# Patient Record
Sex: Female | Born: 1970 | Race: White | Hispanic: No | Marital: Married | Smoking: Never smoker
Health system: Southern US, Community
[De-identification: ages and names within clinical notes are randomized; demographics above are authoritative.]

## PROBLEM LIST (undated history)

## (undated) DIAGNOSIS — F419 Anxiety disorder, unspecified: Secondary | ICD-10-CM

## (undated) DIAGNOSIS — I10 Essential (primary) hypertension: Secondary | ICD-10-CM

## (undated) DIAGNOSIS — Z8669 Personal history of other diseases of the nervous system and sense organs: Secondary | ICD-10-CM

## (undated) DIAGNOSIS — Z87440 Personal history of urinary (tract) infections: Secondary | ICD-10-CM

## (undated) DIAGNOSIS — Z8659 Personal history of other mental and behavioral disorders: Secondary | ICD-10-CM

## (undated) DIAGNOSIS — Z8709 Personal history of other diseases of the respiratory system: Secondary | ICD-10-CM

## (undated) DIAGNOSIS — Z8639 Personal history of other endocrine, nutritional and metabolic disease: Secondary | ICD-10-CM

## (undated) HISTORY — DX: Personal history of other mental and behavioral disorders: Z86.59

## (undated) HISTORY — DX: Personal history of other endocrine, nutritional and metabolic disease: Z86.39

## (undated) HISTORY — DX: Personal history of other diseases of the respiratory system: Z87.09

## (undated) HISTORY — DX: Personal history of urinary (tract) infections: Z87.440

## (undated) HISTORY — DX: Personal history of other diseases of the nervous system and sense organs: Z86.69

---

## 2001-03-20 HISTORY — PX: DILATION AND CURETTAGE OF UTERUS: SHX78

## 2001-05-31 ENCOUNTER — Ambulatory Visit (HOSPITAL_COMMUNITY): Admission: RE | Admit: 2001-05-31 | Discharge: 2001-05-31 | Payer: Self-pay | Admitting: Obstetrics and Gynecology

## 2001-05-31 ENCOUNTER — Encounter: Payer: Self-pay | Admitting: Obstetrics and Gynecology

## 2001-09-10 ENCOUNTER — Other Ambulatory Visit: Admission: RE | Admit: 2001-09-10 | Discharge: 2001-09-10 | Payer: Self-pay | Admitting: Obstetrics and Gynecology

## 2002-11-05 ENCOUNTER — Other Ambulatory Visit: Admission: RE | Admit: 2002-11-05 | Discharge: 2002-11-05 | Payer: Self-pay | Admitting: Obstetrics and Gynecology

## 2003-04-01 ENCOUNTER — Inpatient Hospital Stay (HOSPITAL_COMMUNITY): Admission: AD | Admit: 2003-04-01 | Discharge: 2003-04-01 | Payer: Self-pay | Admitting: Obstetrics and Gynecology

## 2003-05-01 ENCOUNTER — Inpatient Hospital Stay (HOSPITAL_COMMUNITY): Admission: AD | Admit: 2003-05-01 | Discharge: 2003-05-01 | Payer: Self-pay | Admitting: Obstetrics and Gynecology

## 2003-05-05 ENCOUNTER — Inpatient Hospital Stay (HOSPITAL_COMMUNITY): Admission: AD | Admit: 2003-05-05 | Discharge: 2003-05-09 | Payer: Self-pay | Admitting: *Deleted

## 2003-06-11 ENCOUNTER — Other Ambulatory Visit: Admission: RE | Admit: 2003-06-11 | Discharge: 2003-06-11 | Payer: Self-pay | Admitting: Obstetrics and Gynecology

## 2004-06-23 ENCOUNTER — Other Ambulatory Visit: Admission: RE | Admit: 2004-06-23 | Discharge: 2004-06-23 | Payer: Self-pay | Admitting: Obstetrics and Gynecology

## 2006-02-14 ENCOUNTER — Inpatient Hospital Stay (HOSPITAL_COMMUNITY): Admission: AD | Admit: 2006-02-14 | Discharge: 2006-02-14 | Payer: Self-pay | Admitting: Obstetrics and Gynecology

## 2006-07-18 ENCOUNTER — Inpatient Hospital Stay (HOSPITAL_COMMUNITY): Admission: RE | Admit: 2006-07-18 | Discharge: 2006-07-20 | Payer: Self-pay | Admitting: Obstetrics and Gynecology

## 2011-11-13 ENCOUNTER — Other Ambulatory Visit: Payer: Self-pay | Admitting: Internal Medicine

## 2011-11-13 ENCOUNTER — Ambulatory Visit: Payer: Self-pay | Admitting: Internal Medicine

## 2011-11-14 ENCOUNTER — Ambulatory Visit: Payer: Self-pay | Admitting: Internal Medicine

## 2012-12-07 ENCOUNTER — Ambulatory Visit: Payer: Self-pay | Admitting: Neurology

## 2013-02-11 ENCOUNTER — Ambulatory Visit: Payer: Self-pay | Admitting: Neurology

## 2013-02-28 ENCOUNTER — Ambulatory Visit: Payer: Self-pay | Admitting: Neurology

## 2013-06-27 ENCOUNTER — Ambulatory Visit: Payer: Self-pay | Admitting: Family Medicine

## 2013-07-04 ENCOUNTER — Ambulatory Visit (INDEPENDENT_AMBULATORY_CARE_PROVIDER_SITE_OTHER): Payer: No Typology Code available for payment source | Admitting: Family Medicine

## 2013-07-04 ENCOUNTER — Other Ambulatory Visit (HOSPITAL_COMMUNITY)
Admission: RE | Admit: 2013-07-04 | Discharge: 2013-07-04 | Disposition: A | Discharge status: Home / Self Care | Payer: No Typology Code available for payment source | Source: Ambulatory Visit | Attending: Family Medicine | Admitting: Family Medicine

## 2013-07-04 ENCOUNTER — Encounter: Payer: Self-pay | Admitting: Family Medicine

## 2013-07-04 VITALS — BP 112/86 | HR 56 | Temp 98.6°F | Ht 62.25 in | Wt 158.0 lb

## 2013-07-04 DIAGNOSIS — Z0000 Encounter for general adult medical examination without abnormal findings: Secondary | ICD-10-CM | POA: Insufficient documentation

## 2013-07-04 DIAGNOSIS — Z124 Encounter for screening for malignant neoplasm of cervix: Secondary | ICD-10-CM | POA: Insufficient documentation

## 2013-07-04 DIAGNOSIS — M25476 Effusion, unspecified foot: Secondary | ICD-10-CM

## 2013-07-04 DIAGNOSIS — F341 Dysthymic disorder: Secondary | ICD-10-CM

## 2013-07-04 DIAGNOSIS — M25473 Effusion, unspecified ankle: Secondary | ICD-10-CM | POA: Insufficient documentation

## 2013-07-04 DIAGNOSIS — F418 Other specified anxiety disorders: Secondary | ICD-10-CM

## 2013-07-04 DIAGNOSIS — J309 Allergic rhinitis, unspecified: Secondary | ICD-10-CM

## 2013-07-04 DIAGNOSIS — Z23 Encounter for immunization: Secondary | ICD-10-CM

## 2013-07-04 DIAGNOSIS — N390 Urinary tract infection, site not specified: Secondary | ICD-10-CM

## 2013-07-04 DIAGNOSIS — Z01419 Encounter for gynecological examination (general) (routine) without abnormal findings: Secondary | ICD-10-CM | POA: Insufficient documentation

## 2013-07-04 DIAGNOSIS — G4733 Obstructive sleep apnea (adult) (pediatric): Secondary | ICD-10-CM | POA: Insufficient documentation

## 2013-07-04 DIAGNOSIS — Z1151 Encounter for screening for human papillomavirus (HPV): Secondary | ICD-10-CM | POA: Insufficient documentation

## 2013-07-04 LAB — CBC WITH DIFFERENTIAL/PLATELET
BASOS ABS: 0 10*3/uL (ref 0.0–0.1)
Basophils Relative: 0.5 % (ref 0.0–3.0)
EOS PCT: 1.4 % (ref 0.0–5.0)
Eosinophils Absolute: 0.1 10*3/uL (ref 0.0–0.7)
HCT: 36.1 % (ref 36.0–46.0)
Hemoglobin: 12.1 g/dL (ref 12.0–15.0)
Lymphocytes Relative: 28.2 % (ref 12.0–46.0)
Lymphs Abs: 1.7 10*3/uL (ref 0.7–4.0)
MCHC: 33.3 g/dL (ref 30.0–36.0)
MCV: 83.4 fl (ref 78.0–100.0)
MONOS PCT: 8.8 % (ref 3.0–12.0)
Monocytes Absolute: 0.5 10*3/uL (ref 0.1–1.0)
NEUTROS PCT: 61.1 % (ref 43.0–77.0)
Neutro Abs: 3.6 10*3/uL (ref 1.4–7.7)
PLATELETS: 226 10*3/uL (ref 150.0–400.0)
RBC: 4.33 Mil/uL (ref 3.87–5.11)
RDW: 13.5 % (ref 11.5–14.6)
WBC: 5.9 10*3/uL (ref 4.5–10.5)

## 2013-07-04 LAB — LIPID PANEL
CHOL/HDL RATIO: 6
Cholesterol: 200 mg/dL (ref 0–200)
HDL: 31 mg/dL — ABNORMAL LOW (ref >39.00)
LDL Cholesterol: 135 mg/dL — ABNORMAL HIGH (ref 0–99)
Triglycerides: 168 mg/dL — ABNORMAL HIGH (ref 0.0–149.0)
VLDL: 33.6 mg/dL (ref 0.0–40.0)

## 2013-07-04 LAB — COMPREHENSIVE METABOLIC PANEL
ALBUMIN: 3.9 g/dL (ref 3.5–5.2)
ALK PHOS: 59 U/L (ref 39–117)
ALT: 22 U/L (ref 0–35)
AST: 23 U/L (ref 0–37)
BUN: 14 mg/dL (ref 6–23)
CO2: 28 meq/L (ref 19–32)
Calcium: 9.2 mg/dL (ref 8.4–10.5)
Chloride: 104 mEq/L (ref 96–112)
Creatinine, Ser: 0.7 mg/dL (ref 0.4–1.2)
GFR: 96.54 mL/min (ref >60.00)
Glucose, Bld: 80 mg/dL (ref 70–99)
POTASSIUM: 3.7 meq/L (ref 3.5–5.1)
SODIUM: 140 meq/L (ref 135–145)
TOTAL PROTEIN: 7.3 g/dL (ref 6.0–8.3)
Total Bilirubin: 0.5 mg/dL (ref 0.3–1.2)

## 2013-07-04 LAB — TSH: TSH: 1.54 u[IU]/mL (ref 0.35–5.50)

## 2013-07-04 NOTE — Patient Instructions (Signed)
Labs today  Tdap vaccine today  Pap done today  Take care of yourself  Keep drinking water/ watch salt intake and elevate your feet when sitting and also support stockings if you can tolerate them

## 2013-07-04 NOTE — Progress Notes (Signed)
Pre visit review using our clinic review tool, if applicable. No additional management support is needed unless otherwise documented below in the visit note. 

## 2013-07-04 NOTE — Progress Notes (Signed)
Subjective:    Patient ID: Mallory Jones, female    DOB: 1970-11-26, 43 y.o.   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  Has a 51427 year old and 41 year old - boy and a girl    bmi is [id number]  Is due for gyn exam/pap - she is at the very end of her period - light Menses is changing with time - some clotting and then other times light - but regular  Does have PMS  She tried OC in the past -and it messed with her moods  Has had several CS  Td - needs one  Flu vaccine -does not get flu vaccines  Last pap spring 2014  Hx of depression /OCD/anxiety-- [facility] Sherryll BurgerShah is neurologist - had headache issues in the past  Klonopin Luvox (she wants to go back to zoloft)   Hx of all rhinitis  Hx of hyperlipidemia  She walks 2 1/2 mil per day , and also drinking lots of water  She is watching what she eats  Wants to loose weight   She tends to swell in ankles - put on hctz  No cardiac symptoms   Labs about a year ago   Patient Active Problem List   Diagnosis Date Noted  . Encounter for routine gynecological examination 07/04/2013  . Obstructive sleep apnea 07/04/2013  . Allergic rhinitis 07/04/2013  . Routine general medical examination at a health care facility 07/04/2013  . Depression with anxiety 07/04/2013  . Ankle swelling 07/04/2013  . Frequent UTI 07/04/2013   Past Medical History  Diagnosis Date  . History of depression   . History of hay fever   . History of hyperlipidemia   . History of UTI   . History of sleep apnea    Past Surgical History  Procedure Laterality Date  . Cesarean section  2005  . Cesarean section  2008  . Dilation and curettage of uterus  2003   History  Substance Use Topics  . Smoking status: Never  Smoker   . Smokeless tobacco: Not on file  . Alcohol Use: No   Family History  Problem Relation Age of Onset  . Alcohol abuse Father   . Colon cancer Father   . Ovarian cancer Maternal Grandmother   . Prostate cancer Maternal Grandfather   . Hyperlipidemia Father   . Kidney disease Maternal Grandmother   . Other Father     emotional illness   No Known Allergies No current outpatient prescriptions on file prior to visit.   No current facility-administered medications on file prior to visit.    Review of Systems    Review of Systems  Constitutional: Negative for fever, appetite change, fatigue and unexpected weight change.  Eyes: Negative for pain and visual disturbance.  Respiratory: Negative for cough and shortness of breath.   Cardiovascular: Negative for cp or palpitations    Gastrointestinal: Negative for nausea, diarrhea and constipation.  Genitourinary: Negative for urgency and frequency.  Skin: Negative for pallor or rash   Neurological: Negative for weakness, light-headedness, numbness and headaches.  Hematological: Negative for adenopathy. Does not bruise/bleed easily.  Psychiatric/Behavioral: pos for depression that is fairly controlled . The  patient is not nervous/anxious.      Objective:   Physical Exam  Constitutional: She appears well-developed and well-nourished. No distress.  HENT:  Head: Normocephalic and atraumatic.  Right Ear: External ear normal.  Left Ear: External ear normal.  Nose: Nose normal.  Mouth/Throat: Oropharynx is clear and moist.  Eyes: Conjunctivae and EOM are normal. Pupils are equal, round, and reactive to light. Right eye exhibits no discharge. Left eye exhibits no discharge. No scleral icterus.  Neck: Normal range of motion. Neck supple. No JVD present. No thyromegaly present.  Cardiovascular: Normal rate, regular rhythm, normal heart sounds and intact distal pulses.  Exam reveals no gallop.   Pulmonary/Chest: Effort normal and  breath sounds normal. No respiratory distress. She has no wheezes. She has no rales.  Abdominal: Soft. Bowel sounds are normal. She exhibits no distension and no mass. There is no tenderness.  Genitourinary: No breast swelling, tenderness, discharge or bleeding. There is no rash, tenderness or lesion on the right labia. There is no rash, tenderness or lesion on the left labia. Uterus is not enlarged and not tender. Cervix exhibits no motion tenderness, no discharge and no friability. Right adnexum displays no mass, no tenderness and no fullness. Left adnexum displays no mass, no tenderness and no fullness. There is bleeding around the vagina. No vaginal discharge found.  Breast exam: No mass, nodules, thickening, tenderness, bulging, retraction, inflamation, nipple discharge or skin changes noted.  No axillary or clavicular LA.      Note: -on menses/ spotting/ blood at OS today  Musculoskeletal: She exhibits no edema and no tenderness.  Lymphadenopathy:    She has no cervical adenopathy.  Neurological: She is alert. She has normal reflexes. No cranial nerve deficit. She exhibits normal muscle tone. Coordination normal.  Skin: Skin is warm and dry. No rash noted. No erythema. No pallor.  Psychiatric: She has a normal mood and affect.          Assessment & Plan:

## 2013-07-06 NOTE — Assessment & Plan Note (Signed)
occ otc antihistamine

## 2013-07-06 NOTE — Assessment & Plan Note (Signed)
Reviewed health habits including diet and exercise and skin cancer prevention Reviewed appropriate screening tests for age  Also reviewed health mt list, fam hx and immunization status , as well as social and family history   Lab today Tdap today

## 2013-07-06 NOTE — Assessment & Plan Note (Signed)
Pt continues to see [facility] She is consid changing ssri

## 2013-07-06 NOTE — Assessment & Plan Note (Signed)
No pitting  No cardiac symptoms  Lab today  No help with hctz- ? If lymphedema Disc elevation of legs/ low salt diet and use of support hose

## 2013-07-06 NOTE — Assessment & Plan Note (Signed)
Exam done At end of menses with spotting No complaints

## 2013-07-07 ENCOUNTER — Encounter: Payer: Self-pay | Admitting: *Deleted

## 2013-07-08 ENCOUNTER — Telehealth: Payer: Self-pay

## 2013-07-08 NOTE — Telephone Encounter (Signed)
Pt request 07/04/13 lab results; pt notified as instructed by result note. Pt voiced understanding.

## 2013-07-14 ENCOUNTER — Encounter: Payer: Self-pay | Admitting: *Deleted

## 2013-09-16 ENCOUNTER — Ambulatory Visit: Payer: No Typology Code available for payment source | Admitting: Family Medicine

## 2013-09-17 ENCOUNTER — Ambulatory Visit: Payer: No Typology Code available for payment source | Admitting: Family Medicine

## 2013-09-22 ENCOUNTER — Ambulatory Visit (INDEPENDENT_AMBULATORY_CARE_PROVIDER_SITE_OTHER): Payer: No Typology Code available for payment source | Admitting: Family Medicine

## 2013-09-22 ENCOUNTER — Encounter: Payer: Self-pay | Admitting: Family Medicine

## 2013-09-22 VITALS — BP 122/78 | HR 59 | Temp 97.9°F | Ht 62.25 in | Wt 158.2 lb

## 2013-09-22 DIAGNOSIS — F341 Dysthymic disorder: Secondary | ICD-10-CM

## 2013-09-22 DIAGNOSIS — F418 Other specified anxiety disorders: Secondary | ICD-10-CM

## 2013-09-22 DIAGNOSIS — R5381 Other malaise: Secondary | ICD-10-CM

## 2013-09-22 DIAGNOSIS — R5383 Other fatigue: Secondary | ICD-10-CM | POA: Insufficient documentation

## 2013-09-22 DIAGNOSIS — N943 Premenstrual tension syndrome: Secondary | ICD-10-CM

## 2013-09-22 DIAGNOSIS — R5382 Chronic fatigue, unspecified: Secondary | ICD-10-CM

## 2013-09-22 DIAGNOSIS — F3281 Premenstrual dysphoric disorder: Secondary | ICD-10-CM | POA: Insufficient documentation

## 2013-09-22 MED ORDER — DROSPIRENONE-ETHINYL ESTRADIOL 3-0.02 MG PO TABS: 1.0000 | ORAL_TABLET | Freq: Every day | ORAL | Status: DC

## 2013-09-22 NOTE — Patient Instructions (Addendum)
Try Yaz (generic)- at the same time every day- start the first Sunday after your period starts (actually you can start it today since you just had a period) - to help with mood and also heavy periods If problems- let me know  Try to get more exercise  Keep working with your psychiatrist

## 2013-09-22 NOTE — Assessment & Plan Note (Signed)
Rev last labs Diff incl hormonal change /perimenopause/ dep with anx/ PMDD  Her cpap is utd/ settings  Will try regularing menses with Yaz and continue work with her psychiatrist

## 2013-09-22 NOTE — Progress Notes (Signed)
Pre visit review using our clinic review tool, if applicable. No additional management support is needed unless otherwise documented below in the visit note. 

## 2013-09-22 NOTE — Assessment & Plan Note (Signed)
Will try yaz (generic) Intol of other OC but has never tried this  Long discussion re: way to take OC properly and avoidance of smoking  Risks of blood clots outlined as well as possible side eff Pt aware that this does not prevent stds and condoms should still be used inst that it may take up to 3 months for menses to fall into rhythm or side eff to stop  Adv to call if problems or questions    Her psychiatrist is also inc dose of luvox around menses and charting mood

## 2013-09-22 NOTE — Progress Notes (Signed)
Subjective:    Patient ID: Mallory ShireLisa P Calandra, female    DOB: 12-03-1970, 43 y.o.   MRN: [id number]  HPI Here for unexplained fatigue    She is tx for dep/anx and OCD for years- [facility]  She is filling out a mood disorder chart -following it closely and she inc her dose of Luvox  cpap is well titrated  Some walking for exercise  She thinks she sleeps "too much" -not as bad as it used to be Gets about 9 hours per night   Thinks some of her changes are hormonal She is having symptoms that are time related/ cyclic   More anxious than usual  Around ovulation - she gets very sleepy - and at the same time she gets anxious  At the end of period- very irritable (like she is "gonna loose her mind")  Heavy periods with clots  Last 5-6 days  This has worsened over the years  Not on any hormonal contraception- uses condoms     Office Visit on 07/04/2013  Component Date Value Ref Range Status  . WBC 07/04/2013 5.9  4.5 - 10.5 K/uL Final  . RBC 07/04/2013 4.33  3.87 - 5.11 Mil/uL Final  . Hemoglobin 07/04/2013 12.1  12.0 - 15.0 g/dL Final  . HCT 62/13/086504/17/2015 36.1  36.0 - 46.0 % Final  . MCV 07/04/2013 83.4  78.0 - 100.0 fl Final  . MCHC 07/04/2013 33.3  30.0 - 36.0 g/dL Final  . RDW 78/46/962904/17/2015 13.5  11.5 - 14.6 % Final  . Platelets 07/04/2013 226.0  150.0 - 400.0 K/uL Final  . Neutrophils Relative % 07/04/2013 61.1  43.0 - 77.0 % Final  . Lymphocytes Relative 07/04/2013 28.2  12.0 - 46.0 % Final  . Monocytes Relative 07/04/2013 8.8  3.0 - 12.0 % Final  . Eosinophils Relative 07/04/2013 1.4  0.0 - 5.0 % Final  . Basophils Relative 07/04/2013 0.5  0.0 - 3.0 % Final  . Neutro Abs 07/04/2013 3.6  1.4 - 7.7 K/uL Final  . Lymphs Abs 07/04/2013 1.7  0.7 - 4.0 K/uL Final  . Monocytes Absolute 07/04/2013 0.5  0.1 - 1.0 K/uL Final  . Eosinophils Absolute 07/04/2013 0.1  0.0 - 0.7 K/uL Final  . Basophils Absolute 07/04/2013 0.0  0.0 - 0.1 K/uL Final  . Sodium 07/04/2013 140  135 -  145 mEq/L Final  . Potassium 07/04/2013 3.7  3.5 - 5.1 mEq/L Final  . Chloride 07/04/2013 104  96 - 112 mEq/L Final  . CO2 07/04/2013 28  19 - 32 mEq/L Final  . Glucose, Bld 07/04/2013 80  70 - 99 mg/dL Final  . BUN 52/84/132404/17/2015 14  6 - 23 mg/dL Final  . Creatinine, Ser 07/04/2013 0.7  0.4 - 1.2 mg/dL Final  . Total Bilirubin 07/04/2013 0.5  0.3 - 1.2 mg/dL Final  . Alkaline Phosphatase 07/04/2013 59  39 - 117 U/L Final  . AST 07/04/2013 23  0 - 37 U/L Final  . ALT 07/04/2013 22  0 - 35 U/L Final  . Total Protein 07/04/2013 7.3  6.0 - 8.3 g/dL Final  . Albumin 40/10/272504/17/2015 3.9  3.5 - 5.2 g/dL Final  . Calcium 36/64/403404/17/2015 9.2  8.4 - 10.5 mg/dL Final  . GFR 74/25/956304/17/2015 96.54  >60.00 mL/min Final  . TSH 07/04/2013 1.54  0.35 - 5.50 uIU/mL Final  . Cholesterol 07/04/2013 200  0 - 200 mg/dL Final   ATP III Classification       Desirable:  <  200 mg/dL               Borderline High:  200 - 239 mg/dL          High:  > = 161240 mg/dL  . Triglycerides 07/04/2013 168.0* 0.0 - 149.0 mg/dL Final   Normal:  <096<150 mg/dLBorderline High:  150 - 199 mg/dL  . HDL 07/04/2013 31.00* >39.00 mg/dL Final  . VLDL 04/54/098104/17/2015 33.6  0.0 - 40.0 mg/dL Final  . LDL Cholesterol 07/04/2013 135* 0 - 99 mg/dL Final  . Total CHOL/HDL Ratio 07/04/2013 6   Final                  Men          Women1/2 Average Risk     3.4          3.3Average Risk          5.0          4.42X Average Risk          9.6          7.13X Average Risk          15.0          11.0                          Review of Systems Review of Systems  Constitutional: Negative for fever, appetite change,  and unexpected weight change.pos for fatigue   Eyes: Negative for pain and visual disturbance.  Respiratory: Negative for cough and shortness of breath.   Cardiovascular: Negative for cp or palpitations    Gastrointestinal: Negative for nausea, diarrhea and constipation.  Genitourinary: Negative for urgency and frequency.  Skin: Negative for pallor or rash     Neurological: Negative for weakness, light-headedness, numbness and headaches.  Hematological: Negative for adenopathy. Does not bruise/bleed easily.  Psychiatric/Behavioral: pos for mood lability/ anxiety/irritability with menses and ovulation .         Objective:   Physical Exam  Constitutional: She appears well-developed and well-nourished. No distress.  overwt and well appearing   HENT:  Head: Normocephalic and atraumatic.  Mouth/Throat: Oropharynx is clear and moist.  Eyes: Conjunctivae and EOM are normal. Pupils are equal, round, and reactive to light. No scleral icterus.  Neck: Normal range of motion. Neck supple. No JVD present. No thyromegaly present.  Cardiovascular: Normal rate, regular rhythm and normal heart sounds.  Exam reveals no gallop.   Pulmonary/Chest: Effort normal and breath sounds normal. No respiratory distress. She has no wheezes. She has no rales.  Musculoskeletal: She exhibits no edema.  Lymphadenopathy:    She has no cervical adenopathy.  Neurological: She is alert. She has normal reflexes. No cranial nerve deficit. She exhibits normal muscle tone. Coordination normal.  Skin: Skin is warm and dry.  Psychiatric: She has a normal mood and affect. Her behavior is normal. Thought content normal.          Assessment & Plan:   Problem List Items Addressed This Visit     Other   Depression with anxiety     Now having some more cyclic symptoms - with menses and mid cycle ?if PMDD symptoms  She will continue working on her SSRI management with psychiatry- is trying inc dose around menses  Will also try Yaz and see how she does    Fatigue - Primary     Rev last labs Diff incl hormonal change /perimenopause/  dep with anx/ PMDD  Her cpap is utd/ settings  Will try regularing menses with Yaz and continue work with her psychiatrist    PMDD (premenstrual dysphoric disorder)     Will try yaz (generic) Intol of other OC but has never tried this  Long  discussion re: way to take OC properly and avoidance of smoking  Risks of blood clots outlined as well as possible side eff Pt aware that this does not prevent stds and condoms should still be used inst that it may take up to 3 months for menses to fall into rhythm or side eff to stop  Adv to call if problems or questions    Her psychiatrist is also inc dose of luvox around menses and charting mood

## 2013-09-22 NOTE — Assessment & Plan Note (Signed)
Now having some more cyclic symptoms - with menses and mid cycle ?if PMDD symptoms  She will continue working on her SSRI management with psychiatry- is trying inc dose around menses  Will also try Yaz and see how she does

## 2013-11-10 ENCOUNTER — Telehealth: Payer: Self-pay | Admitting: Family Medicine

## 2013-11-10 NOTE — Telephone Encounter (Signed)
Please send for last psychiatry note so I can review (she will have to give consent) The last time we talked -she was working with psychiatry re: adj her luvox dose and charting mood I believe  If note indicate she is very stable now- it should not be a problem  Please send this back to me to hold until I rev records-thanks

## 2013-11-10 NOTE — Telephone Encounter (Signed)
Called pt and no answer and no voicemail (? If someone answered and d/c call) will try to call back later

## 2013-11-10 NOTE — Telephone Encounter (Signed)
Was seeing phyciatrist for OCD and Anxiety meds. Pt feels she does not need to go see them anymore and wanted to know if you would be able to see her for this and prescribe her medications. Please advise.

## 2013-12-05 NOTE — Telephone Encounter (Signed)
Called pt and no answer/ voicemail so letter mailed letting pt know Dr. Royden Purl recommendations

## 2013-12-05 NOTE — Telephone Encounter (Signed)
Shapale- did you try to call pt back again?  Last time she was contacted was on 11/10/13

## 2014-07-07 ENCOUNTER — Other Ambulatory Visit (HOSPITAL_COMMUNITY)
Admission: RE | Admit: 2014-07-07 | Discharge: 2014-07-07 | Disposition: A | Discharge status: Home / Self Care | Payer: No Typology Code available for payment source | Source: Ambulatory Visit | Attending: Family Medicine | Admitting: Family Medicine

## 2014-07-07 ENCOUNTER — Encounter: Payer: Self-pay | Admitting: Family Medicine

## 2014-07-07 ENCOUNTER — Ambulatory Visit (INDEPENDENT_AMBULATORY_CARE_PROVIDER_SITE_OTHER): Payer: No Typology Code available for payment source | Admitting: Family Medicine

## 2014-07-07 VITALS — BP 122/78 | HR 92 | Temp 98.5°F | Ht 62.0 in | Wt 159.0 lb

## 2014-07-07 DIAGNOSIS — Z0000 Encounter for general adult medical examination without abnormal findings: Secondary | ICD-10-CM | POA: Diagnosis not present

## 2014-07-07 DIAGNOSIS — Z1231 Encounter for screening mammogram for malignant neoplasm of breast: Secondary | ICD-10-CM

## 2014-07-07 DIAGNOSIS — F3281 Premenstrual dysphoric disorder: Secondary | ICD-10-CM

## 2014-07-07 DIAGNOSIS — F418 Other specified anxiety disorders: Secondary | ICD-10-CM

## 2014-07-07 DIAGNOSIS — N943 Premenstrual tension syndrome: Secondary | ICD-10-CM

## 2014-07-07 DIAGNOSIS — Z01419 Encounter for gynecological examination (general) (routine) without abnormal findings: Secondary | ICD-10-CM | POA: Diagnosis not present

## 2014-07-07 DIAGNOSIS — Z800 Family history of malignant neoplasm of digestive organs: Secondary | ICD-10-CM | POA: Insufficient documentation

## 2014-07-07 DIAGNOSIS — Z79899 Other long term (current) drug therapy: Secondary | ICD-10-CM | POA: Diagnosis not present

## 2014-07-07 DIAGNOSIS — Z1211 Encounter for screening for malignant neoplasm of colon: Secondary | ICD-10-CM

## 2014-07-07 NOTE — Progress Notes (Signed)
Pre visit review using our clinic review tool, if applicable. No additional management support is needed unless otherwise documented below in the visit note. 

## 2014-07-07 NOTE — Patient Instructions (Addendum)
Pap today  Labs today Finish this pack of pills and then stop- see how the PMS goes  Continue psychiatric follow up and couseling  Stop at check out regarding referral for screening mammogram and colonoscopy

## 2014-07-07 NOTE — Progress Notes (Signed)
Subjective:    Patient ID: Mallory Jones, female    DOB: 09/16/1970, 44 y.o.   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  Is working full time  Hard on her    Wt is stable with bmi of 29  HIV screen - declines   Flu shot -does not get - declines   Family hx of colon cancer -father  Is due to start screening colonoscopies    Pap 4/15 nl  Is on Yaz - and is very emotional  The week she starts menses- her anxiety and tearfulness is worse  The OC has not helped  Husband is getting a vasectomy -will not need birth control  Does not need for period control  Needs that today   Mammogram has had one 3 years ago  Needs to start getting them yearly  Self exam - no breast lumps   Td 4/15  Due for wellness  labs  She is interested in thyroid check   Patient Active Problem List   Diagnosis Date Noted  . Encounter for screening mammogram for breast cancer 07/07/2014  . Family history of colon cancer 07/07/2014  . Colon cancer screening 07/07/2014  . Fatigue 09/22/2013  . PMDD (premenstrual dysphoric disorder) 09/22/2013  . Encounter for routine gynecological examination 07/04/2013  . Obstructive sleep apnea 07/04/2013  . Allergic rhinitis 07/04/2013  . Routine general medical examination at a health care facility 07/04/2013  . Depression with anxiety 07/04/2013  . Ankle swelling 07/04/2013  . Frequent UTI 07/04/2013   Past Medical History  Diagnosis Date  .  History of depression   . History of hay fever   . History of hyperlipidemia   . History of UTI   . History of sleep apnea    Past Surgical History  Procedure Laterality Date  . Cesarean section  2005  . Cesarean section  2008  . Dilation and curettage of uterus  2003   History  Substance Use Topics  . Smoking status: Never Smoker   . Smokeless tobacco: Never Used  . Alcohol Use: No   Family History  Problem Relation Age of Onset  . Alcohol abuse Father   . Colon cancer Father   . Ovarian cancer Maternal Grandmother   . Prostate cancer Maternal Grandfather   . Hyperlipidemia Father   . Kidney disease Maternal Grandmother   . Other Father     emotional illness   No Known Allergies No current outpatient prescriptions on file prior to visit.   No current facility-administered medications on file prior to visit.    Review of Systems Review of Systems  Constitutional: Negative for fever, appetite change, fatigue and unexpected weight change.  Eyes: Negative for pain and visual disturbance.  Respiratory: Negative for cough and shortness of breath.   Cardiovascular: Negative for cp or palpitations    Gastrointestinal: Negative for nausea, diarrhea  and constipation.  Genitourinary: Negative for urgency and frequency.  Skin: Negative for pallor or rash   Neurological: Negative for weakness, light-headedness, numbness and headaches.  Hematological: Negative for adenopathy. Does not bruise/bleed easily.  Psychiatric/Behavioral: pos for depression and anxiety with some stressors, declines suicidal thoughts       Objective:   Physical Exam  Constitutional: She appears well-developed and well-nourished. No distress.  obese and well appearing   HENT:  Head: Normocephalic and atraumatic.  Right Ear: External ear normal.  Left Ear: External ear normal.  Mouth/Throat: Oropharynx is clear and moist.  Nares are boggy  Eyes: Conjunctivae and EOM are normal. Pupils are equal,  round, and reactive to light. No scleral icterus.  Neck: Normal range of motion. Neck supple. No JVD present. Carotid bruit is not present. No thyromegaly present.  Cardiovascular: Normal rate, regular rhythm, normal heart sounds and intact distal pulses.  Exam reveals no gallop.   Pulmonary/Chest: Effort normal and breath sounds normal. No respiratory distress. She has no wheezes. She exhibits no tenderness.  Abdominal: Soft. Bowel sounds are normal. She exhibits no distension, no abdominal bruit and no mass. There is no tenderness.  Genitourinary: No breast swelling, tenderness, discharge or bleeding. There is no rash, tenderness or lesion on the right labia. There is no rash, tenderness or lesion on the left labia. Uterus is not enlarged and not tender. Cervix exhibits no motion tenderness, no discharge and no friability. Right adnexum displays no mass, no tenderness and no fullness. Left adnexum displays no mass, no tenderness and no fullness. No erythema or bleeding in the vagina. No vaginal discharge found.  Breast exam: No mass, nodules, thickening, tenderness, bulging, retraction, inflamation, nipple discharge or skin changes noted.  No axillary or clavicular LA.      Musculoskeletal: Normal range of motion. She exhibits no edema or tenderness.  Lymphadenopathy:    She has no cervical adenopathy.  Neurological: She is alert. She has normal reflexes. No cranial nerve deficit. She exhibits normal muscle tone. Coordination normal.  Skin: Skin is warm and dry. No rash noted. No erythema. No pallor.  Psychiatric: Her speech is normal and behavior is normal. Thought content normal. Her mood appears anxious. Her affect is not blunt, not labile and not inappropriate.  Very anxious  Tearful at times but communicates well           Assessment & Plan:   Problem List Items Addressed This Visit      Other   Colon cancer screening   Relevant Orders   Ambulatory referral to Gastroenterology    Depression with anxiety    Pt is struggling with anxiety Has psychiatrist and counselor  Enc her to continue her f/u appts and work on coping skills       Encounter for routine gynecological examination    Exam and pap done  No complaints besides PMDD Stopping OC after this pack because it is not helping       Relevant Orders   Cytology - PAP (Completed)   Encounter for screening mammogram for breast cancer    Scheduled annual screening mammogram Nl breast exam today  Encouraged monthly self exams        Relevant Orders   MM DIGITAL SCREENING BILATERAL   Family history of colon cancer    Ref for screening colonoscopy  Father had colon cancer       Relevant Orders   Ambulatory referral to Gastroenterology   PMDD (premenstrual dysphoric disorder)  Pt will hold the OC since it does not seem to help her symptoms  In addition-continue to work with her psychiatrist for depression and anxiety       Relevant Medications   busPIRone (BUSPAR) 15 MG tablet   fluvoxaMINE (LUVOX) 100 MG tablet   clomiPRAMINE (ANAFRANIL) 25 MG capsule   Routine general medical examination at a health care facility - Primary    Reviewed health habits including diet and exercise and skin cancer prevention Reviewed appropriate screening tests for age  Also reviewed health mt list, fam hx and immunization status , as well as social and family history   Labs today Ref for annual screening mammogram and colonoscopy (due to fam hx of colon cancer)       Relevant Orders   CBC with Differential/Platelet (Completed)   Comprehensive metabolic panel (Completed)   TSH (Completed)   Lipid panel (Completed)

## 2014-07-08 LAB — CBC WITH DIFFERENTIAL/PLATELET
Basophils Absolute: 0.2 10*3/uL — ABNORMAL HIGH (ref 0.0–0.1)
Basophils Relative: 1.8 % (ref 0.0–3.0)
Eosinophils Absolute: 0.1 10*3/uL (ref 0.0–0.7)
Eosinophils Relative: 1.4 % (ref 0.0–5.0)
HCT: 40 % (ref 36.0–46.0)
Hemoglobin: 13.6 g/dL (ref 12.0–15.0)
Lymphocytes Relative: 21.6 % (ref 12.0–46.0)
Lymphs Abs: 2.2 10*3/uL (ref 0.7–4.0)
MCHC: 34.1 g/dL (ref 30.0–36.0)
MCV: 82.2 fl (ref 78.0–100.0)
Monocytes Absolute: 0.4 10*3/uL (ref 0.1–1.0)
Monocytes Relative: 4 % (ref 3.0–12.0)
Neutro Abs: 7.2 10*3/uL (ref 1.4–7.7)
Neutrophils Relative %: 71.2 % (ref 43.0–77.0)
Platelets: 279 10*3/uL (ref 150.0–400.0)
RBC: 4.87 Mil/uL (ref 3.87–5.11)
RDW: 13.4 % (ref 11.5–15.5)
WBC: 10.1 10*3/uL (ref 4.0–10.5)

## 2014-07-08 LAB — COMPREHENSIVE METABOLIC PANEL
ALK PHOS: 68 U/L (ref 39–117)
ALT: 13 U/L (ref 0–35)
AST: 15 U/L (ref 0–37)
Albumin: 4 g/dL (ref 3.5–5.2)
BUN: 11 mg/dL (ref 6–23)
CO2: 27 mEq/L (ref 19–32)
Calcium: 9.9 mg/dL (ref 8.4–10.5)
Chloride: 103 mEq/L (ref 96–112)
Creatinine, Ser: 0.96 mg/dL (ref 0.40–1.20)
GFR: 67.82 mL/min (ref >60.00)
GLUCOSE: 83 mg/dL (ref 70–99)
POTASSIUM: 4.1 meq/L (ref 3.5–5.1)
Sodium: 137 mEq/L (ref 135–145)
TOTAL PROTEIN: 7.2 g/dL (ref 6.0–8.3)
Total Bilirubin: 0.2 mg/dL (ref 0.2–1.2)

## 2014-07-08 LAB — LIPID PANEL
Cholesterol: 215 mg/dL — ABNORMAL HIGH (ref 0–200)
HDL: 46.3 mg/dL (ref >39.00)
NonHDL: 168.7
Total CHOL/HDL Ratio: 5
Triglycerides: 211 mg/dL — ABNORMAL HIGH (ref 0.0–149.0)
VLDL: 42.2 mg/dL — ABNORMAL HIGH (ref 0.0–40.0)

## 2014-07-08 LAB — LDL CHOLESTEROL, DIRECT: Direct LDL: 131 mg/dL

## 2014-07-08 LAB — TSH: TSH: 1.36 u[IU]/mL (ref 0.35–4.50)

## 2014-07-09 ENCOUNTER — Encounter: Payer: Self-pay | Admitting: *Deleted

## 2014-07-09 ENCOUNTER — Telehealth: Payer: Self-pay | Admitting: Family Medicine

## 2014-07-09 LAB — CYTOLOGY - PAP

## 2014-07-09 MED ORDER — FLUCONAZOLE 150 MG PO TABS: 150.0000 mg | ORAL_TABLET | Freq: Once | ORAL | Status: DC

## 2014-07-09 NOTE — Telephone Encounter (Signed)
Please let pt know that pap is negative - but that it also showed a yeast infection Please call in diflucan

## 2014-07-09 NOTE — Telephone Encounter (Signed)
Pt notified of pap results and Rx sent to pharmacy

## 2014-07-09 NOTE — Assessment & Plan Note (Signed)
Scheduled annual screening mammogram Nl breast exam today  Encouraged monthly self exams   

## 2014-07-09 NOTE — Assessment & Plan Note (Signed)
Pt will hold the OC since it does not seem to help her symptoms  In addition-continue to work with her psychiatrist for depression and anxiety

## 2014-07-09 NOTE — Assessment & Plan Note (Signed)
Reviewed health habits including diet and exercise and skin cancer prevention Reviewed appropriate screening tests for age  Also reviewed health mt list, fam hx and immunization status , as well as social and family history   Labs today Ref for annual screening mammogram and colonoscopy (due to fam hx of colon cancer)

## 2014-07-09 NOTE — Assessment & Plan Note (Signed)
Ref for screening colonoscopy  Father had colon cancer

## 2014-07-09 NOTE — Assessment & Plan Note (Signed)
Pt is struggling with anxiety Has psychiatrist and counselor  Enc her to continue her f/u appts and work on coping skills

## 2014-07-09 NOTE — Assessment & Plan Note (Signed)
Exam and pap done  No complaints besides PMDD Stopping OC after this pack because it is not helping

## 2014-07-20 ENCOUNTER — Telehealth: Payer: Self-pay | Admitting: *Deleted

## 2014-07-20 NOTE — Telephone Encounter (Signed)
Her labs looked ok incl thyroid  That is re assuring  I think fatigue may be related to her psyche issues and treatment more likely  We can check a B12 level if she wants (dx: fatigue)

## 2014-07-20 NOTE — Telephone Encounter (Signed)
Patient left a voicemail stating that she was recently in for her annual pap smear. Patient stated that she is real sleepy, tired and lethargic and wants to know if she should have some additional lab work done to see what might be causing these symptoms?

## 2014-07-20 NOTE — Telephone Encounter (Signed)
Pt notified of Dr. Royden Purlower's comments/recommendations and lab appt scheduled 07/22/14 to check b12 level

## 2014-07-21 ENCOUNTER — Other Ambulatory Visit: Payer: Self-pay | Admitting: Family Medicine

## 2014-07-21 DIAGNOSIS — R5383 Other fatigue: Secondary | ICD-10-CM

## 2014-07-22 ENCOUNTER — Other Ambulatory Visit (INDEPENDENT_AMBULATORY_CARE_PROVIDER_SITE_OTHER): Payer: No Typology Code available for payment source

## 2014-07-22 DIAGNOSIS — R5383 Other fatigue: Secondary | ICD-10-CM

## 2014-07-23 LAB — VITAMIN B12: VITAMIN B 12: 188 pg/mL — AB (ref 211–911)

## 2014-07-28 ENCOUNTER — Ambulatory Visit (INDEPENDENT_AMBULATORY_CARE_PROVIDER_SITE_OTHER): Payer: No Typology Code available for payment source | Admitting: *Deleted

## 2014-07-28 DIAGNOSIS — E538 Deficiency of other specified B group vitamins: Secondary | ICD-10-CM

## 2014-07-28 MED ORDER — CYANOCOBALAMIN 1000 MCG/ML IJ SOLN
1000.0000 ug | Freq: Once | INTRAMUSCULAR | Status: AC
  Administered 2014-07-28: 1000 ug via INTRAMUSCULAR

## 2014-08-05 ENCOUNTER — Ambulatory Visit (INDEPENDENT_AMBULATORY_CARE_PROVIDER_SITE_OTHER): Payer: No Typology Code available for payment source | Admitting: *Deleted

## 2014-08-05 DIAGNOSIS — E538 Deficiency of other specified B group vitamins: Secondary | ICD-10-CM | POA: Diagnosis not present

## 2014-08-05 MED ORDER — CYANOCOBALAMIN 1000 MCG/ML IJ SOLN
1000.0000 ug | Freq: Once | INTRAMUSCULAR | Status: AC
  Administered 2014-08-05: 1000 ug via INTRAMUSCULAR

## 2014-08-12 ENCOUNTER — Ambulatory Visit (INDEPENDENT_AMBULATORY_CARE_PROVIDER_SITE_OTHER): Payer: No Typology Code available for payment source | Admitting: *Deleted

## 2014-08-12 DIAGNOSIS — E538 Deficiency of other specified B group vitamins: Secondary | ICD-10-CM | POA: Diagnosis not present

## 2014-08-12 MED ORDER — CYANOCOBALAMIN 1000 MCG/ML IJ SOLN
1000.0000 ug | Freq: Once | INTRAMUSCULAR | Status: AC
  Administered 2014-08-12: 1000 ug via INTRAMUSCULAR

## 2014-08-19 ENCOUNTER — Ambulatory Visit (INDEPENDENT_AMBULATORY_CARE_PROVIDER_SITE_OTHER): Payer: No Typology Code available for payment source | Admitting: *Deleted

## 2014-08-19 DIAGNOSIS — E538 Deficiency of other specified B group vitamins: Secondary | ICD-10-CM

## 2014-08-19 MED ORDER — CYANOCOBALAMIN 1000 MCG/ML IJ SOLN
1000.0000 ug | Freq: Once | INTRAMUSCULAR | Status: AC
  Administered 2014-08-19: 1000 ug via INTRAMUSCULAR

## 2014-08-26 ENCOUNTER — Other Ambulatory Visit (INDEPENDENT_AMBULATORY_CARE_PROVIDER_SITE_OTHER): Payer: No Typology Code available for payment source

## 2014-08-26 ENCOUNTER — Other Ambulatory Visit: Payer: No Typology Code available for payment source

## 2014-08-26 DIAGNOSIS — E538 Deficiency of other specified B group vitamins: Secondary | ICD-10-CM

## 2014-08-27 ENCOUNTER — Encounter: Payer: Self-pay | Admitting: *Deleted

## 2014-08-27 LAB — VITAMIN B12: Vitamin B-12: 1715 pg/mL — ABNORMAL HIGH (ref 211–911)

## 2014-08-28 ENCOUNTER — Encounter: Payer: Self-pay | Admitting: *Deleted

## 2014-10-16 ENCOUNTER — Ambulatory Visit: Payer: No Typology Code available for payment source | Admitting: Anesthesiology

## 2014-10-16 ENCOUNTER — Encounter
Admission: RE | Disposition: A | Discharge status: Home / Self Care | Payer: Self-pay | Source: Ambulatory Visit | Attending: Unknown Physician Specialty

## 2014-10-16 ENCOUNTER — Ambulatory Visit
Admission: RE | Admit: 2014-10-16 | Discharge: 2014-10-16 | Disposition: A | Discharge status: Home / Self Care | Payer: No Typology Code available for payment source | Source: Ambulatory Visit | Attending: Unknown Physician Specialty | Admitting: Unknown Physician Specialty

## 2014-10-16 ENCOUNTER — Encounter: Payer: Self-pay | Admitting: *Deleted

## 2014-10-16 DIAGNOSIS — G4730 Sleep apnea, unspecified: Secondary | ICD-10-CM | POA: Diagnosis not present

## 2014-10-16 DIAGNOSIS — Z800 Family history of malignant neoplasm of digestive organs: Secondary | ICD-10-CM | POA: Insufficient documentation

## 2014-10-16 DIAGNOSIS — F329 Major depressive disorder, single episode, unspecified: Secondary | ICD-10-CM | POA: Diagnosis not present

## 2014-10-16 DIAGNOSIS — Z1211 Encounter for screening for malignant neoplasm of colon: Secondary | ICD-10-CM | POA: Insufficient documentation

## 2014-10-16 DIAGNOSIS — E785 Hyperlipidemia, unspecified: Secondary | ICD-10-CM | POA: Diagnosis not present

## 2014-10-16 DIAGNOSIS — K640 First degree hemorrhoids: Secondary | ICD-10-CM | POA: Insufficient documentation

## 2014-10-16 DIAGNOSIS — Z79899 Other long term (current) drug therapy: Secondary | ICD-10-CM | POA: Diagnosis not present

## 2014-10-16 HISTORY — PX: COLONOSCOPY WITH PROPOFOL: SHX5780

## 2014-10-16 LAB — POCT PREGNANCY, URINE: PREG TEST UR: NEGATIVE

## 2014-10-16 LAB — HCG, QUANTITATIVE, PREGNANCY

## 2014-10-16 SURGERY — COLONOSCOPY WITH PROPOFOL
Anesthesia: General

## 2014-10-16 MED ORDER — PROPOFOL 10 MG/ML IV BOLUS
INTRAVENOUS | Status: DC | PRN
  Administered 2014-10-16: 100 mg via INTRAVENOUS

## 2014-10-16 MED ORDER — FENTANYL CITRATE (PF) 100 MCG/2ML IJ SOLN
INTRAMUSCULAR | Status: DC | PRN
  Administered 2014-10-16: 50 ug via INTRAVENOUS

## 2014-10-16 MED ORDER — PROPOFOL INFUSION 10 MG/ML OPTIME
INTRAVENOUS | Status: DC | PRN
  Administered 2014-10-16: 160 ug/kg/min via INTRAVENOUS

## 2014-10-16 MED ORDER — LIDOCAINE HCL (PF) 1 % IJ SOLN: 2.0000 mL | Freq: Once | INTRAMUSCULAR | Status: DC

## 2014-10-16 MED ORDER — SODIUM CHLORIDE 0.9 % IV SOLN
INTRAVENOUS | Status: DC
  Administered 2014-10-16: 15:00:00 via INTRAVENOUS

## 2014-10-16 MED ORDER — SODIUM CHLORIDE 0.9 % IV SOLN: INTRAVENOUS | Status: DC

## 2014-10-16 MED ORDER — LIDOCAINE HCL (PF) 1 % IJ SOLN
INTRAMUSCULAR | Status: AC
  Filled 2014-10-16: qty 2

## 2014-10-16 MED ORDER — MIDAZOLAM HCL 2 MG/2ML IJ SOLN
INTRAMUSCULAR | Status: DC | PRN
  Administered 2014-10-16: 1 mg via INTRAVENOUS

## 2014-10-16 MED ORDER — LIDOCAINE HCL (CARDIAC) 20 MG/ML IV SOLN
INTRAVENOUS | Status: DC | PRN
  Administered 2014-10-16: 60 mg via INTRAVENOUS

## 2014-10-16 NOTE — Transfer of Care (Signed)
Immediate Anesthesia Transfer of Care Note  Patient: Mallory Jones  Procedure(s) Performed: Procedure(s): COLONOSCOPY WITH PROPOFOL (N/A)  Patient Location: PACU  Anesthesia Type:General  Level of Consciousness: sedated  Airway & Oxygen Therapy: Patient Spontanous Breathing and Patient connected to nasal cannula oxygen  Post-op Assessment: Report given to RN  Post vital signs: Reviewed and stable  Last Vitals:  Filed Vitals:   10/16/14 1442  BP: 144/89  Pulse: 93  Temp: 37 C  Resp: 17    Complications: No apparent anesthesia complications

## 2014-10-16 NOTE — Anesthesia Preprocedure Evaluation (Signed)
Anesthesia Evaluation  Patient identified by MRN, date of birth, ID band Patient awake    Reviewed: Allergy & Precautions, NPO status , Patient's Chart, lab work & pertinent test results  History of Anesthesia Complications (+) PONV  Airway Mallampati: III  TM Distance: >3 FB Neck ROM: Full  Mouth opening: Limited Mouth Opening Comment: TMJ problems. Dental  (+) Teeth Intact   Pulmonary sleep apnea and Continuous Positive Airway Pressure Ventilation ,    Pulmonary exam normal       Cardiovascular Exercise Tolerance: Good Normal cardiovascular exam    Neuro/Psych    GI/Hepatic   Endo/Other    Renal/GU      Musculoskeletal   Abdominal Normal abdominal exam  (+)   Peds  Hematology   Anesthesia Other Findings   Reproductive/Obstetrics                             Anesthesia Physical Anesthesia Plan  ASA: III  Anesthesia Plan: General   Post-op Pain Management:    Induction: Intravenous  Airway Management Planned: Nasal Cannula  Additional Equipment:   Intra-op Plan:   Post-operative Plan:   Informed Consent: I have reviewed the patients History and Physical, chart, labs and discussed the procedure including the risks, benefits and alternatives for the proposed anesthesia with the patient or authorized representative who has indicated his/her understanding and acceptance.     Plan Discussed with: CRNA  Anesthesia Plan Comments:         Anesthesia Quick Evaluation

## 2014-10-16 NOTE — Op Note (Signed)
[facility] Gastroenterology Patient Name: Mallory Jones Procedure Date: 10/16/2014 3:50 PM MRN: [id number] Account #: [id number] Date of Birth: [birth year] Admit Type: Outpatient Age: 44 Room: [facility] ENDO ROOM 1 Gender: Female Note Status: Finalized Procedure:         Colonoscopy Indications:       Screening in patient at increased risk: Family history of                     1st-degree relative with colorectal cancer Providers:         Scot Jun, MD Referring MD:      Danella Penton, MD (Referring MD) Medicines:         Propofol per Anesthesia Complications:     No immediate complications. Procedure:         Pre-Anesthesia Assessment:                    - After reviewing the risks and benefits, the patient was                     deemed in satisfactory condition to undergo the procedure.                    After obtaining informed consent, the colonoscope was                     passed under direct vision. Throughout the procedure, the                     patient's blood pressure, pulse, and oxygen saturations                     were monitored continuously. The Olympus PCF-H180AL                     colonoscope ( S#: [id number] ) was introduced through the                     anus and advanced to the the cecum, identified by                     appendiceal orifice and ileocecal valve. The colonoscopy                     was performed without difficulty. The patient tolerated                     the procedure well. The quality of the bowel preparation                     was excellent. Findings:      Internal hemorrhoids were found during endoscopy. The hemorrhoids were       small and Grade I (internal hemorrhoids that do not prolapse).      The exam was otherwise without abnormality. Impression:        - Internal hemorrhoids.                    - The examination was otherwise normal.                    - No specimens collected. Recommendation:    - Repeat  colonoscopy in 5 years for screening purposes. Scot Jun, MD 10/16/2014 4:23:04 PM This report has been signed  electronically. Number of Addenda: 0 Note Initiated On: 10/16/2014 3:50 PM Scope Withdrawal Time: 0 hours 12 minutes 15 seconds  Total Procedure Duration: 0 hours 19 minutes 9 seconds       [facility]

## 2014-10-16 NOTE — H&P (Signed)
Primary Care Physician:  Danella Penton., MD Primary Gastroenterologist:  Dr. Mechele Collin  Pre-Procedure History & Physical: HPI:  Mallory Jones is a 44 y.o. female is here for an colonoscopy.   Past Medical History  Diagnosis Date  . History of depression   . History of hay fever   . History of hyperlipidemia   . History of UTI   . History of sleep apnea     Past Surgical History  Procedure Laterality Date  . Cesarean section  2005  . Cesarean section  2008  . Dilation and curettage of uterus  2003    Prior to Admission medications   Medication Sig Start Date End Date Taking? Authorizing Provider  cyanocobalamin 1000 MCG tablet Take 100 mcg by mouth daily.   Yes Historical Provider, MD  busPIRone (BUSPAR) 15 MG tablet Take 15 mg by mouth 2 (two) times daily.  06/24/14   Historical Provider, MD  clomiPRAMINE (ANAFRANIL) 25 MG capsule Take 12.5 mg by mouth at bedtime.    Historical Provider, MD  clonazePAM (KLONOPIN) 0.5 MG tablet Take 0.25 mg by mouth 3 (three) times daily as needed for anxiety.     Historical Provider, MD  fluconazole (DIFLUCAN) 150 MG tablet Take 1 tablet (150 mg total) by mouth once. 07/09/14   Judy Pimple, MD  fluvoxaMINE (LUVOX) 100 MG tablet Take 350 mg by mouth at bedtime.    Historical Provider, MD    Allergies as of 09/08/2014  . (No Known Allergies)    Family History  Problem Relation Age of Onset  . Alcohol abuse Father   . Colon cancer Father   . Ovarian cancer Maternal Grandmother   . Prostate cancer Maternal Grandfather   . Hyperlipidemia Father   . Kidney disease Maternal Grandmother   . Other Father     emotional illness    History   Social History  . Marital Status: Married    Spouse Name: N/A  . Number of Children: N/A  . Years of Education: N/A   Occupational History  . Not on file.   Social History Main Topics  . Smoking status: Never Smoker   . Smokeless tobacco: Never Used  . Alcohol Use: No  . Drug Use: No  . Sexual  Activity: Not on file   Other Topics Concern  . Not on file   Social History Narrative    Review of Systems: See HPI, otherwise negative ROS  Physical Exam: BP 144/89 mmHg  Pulse 93  Temp(Src) 98.6 F (37 C) (Tympanic)  Resp 17  Ht 5\' 3"  (1.6 m)  Wt 72.576 kg (160 lb)  BMI 28.35 kg/m2  SpO2 100% General:   Alert,  pleasant and cooperative in NAD Head:  Normocephalic and atraumatic. Neck:  Supple; no masses or thyromegaly. Lungs:  Clear throughout to auscultation.    Heart:  Regular rate and rhythm. Abdomen:  Soft, nontender and nondistended. Normal bowel sounds, without guarding, and without rebound.   Neurologic:  Alert and  oriented x4;  grossly normal neurologically.  Impression/Plan: Mallory Jones is here for an colonoscopy to be performed for screening, FH colon cancer in father.  Risks, benefits, limitations, and alternatives regarding  colonoscopy have been reviewed with the patient.  Questions have been answered.  All parties agreeable.   Lynnae Prude, MD  10/16/2014, 3:49 PM   Primary Care Physician:  Danella Penton., MD Primary Gastroenterologist:  Dr. Mechele Collin  Pre-Procedure History & Physical: HPI:  Mallory Jones  is a 44 y.o. female is here for an colonoscopy.   Past Medical History  Diagnosis Date  . History of depression   . History of hay fever   . History of hyperlipidemia   . History of UTI   . History of sleep apnea     Past Surgical History  Procedure Laterality Date  . Cesarean section  2005  . Cesarean section  2008  . Dilation and curettage of uterus  2003    Prior to Admission medications   Medication Sig Start Date End Date Taking? Authorizing Provider  cyanocobalamin 1000 MCG tablet Take 100 mcg by mouth daily.   Yes Historical Provider, MD  busPIRone (BUSPAR) 15 MG tablet Take 15 mg by mouth 2 (two) times daily.  06/24/14   Historical Provider, MD  clomiPRAMINE (ANAFRANIL) 25 MG capsule Take 12.5 mg by mouth at bedtime.     Historical Provider, MD  clonazePAM (KLONOPIN) 0.5 MG tablet Take 0.25 mg by mouth 3 (three) times daily as needed for anxiety.     Historical Provider, MD  fluconazole (DIFLUCAN) 150 MG tablet Take 1 tablet (150 mg total) by mouth once. 07/09/14   Judy Pimple, MD  fluvoxaMINE (LUVOX) 100 MG tablet Take 350 mg by mouth at bedtime.    Historical Provider, MD    Allergies as of 09/08/2014  . (No Known Allergies)    Family History  Problem Relation Age of Onset  . Alcohol abuse Father   . Colon cancer Father   . Ovarian cancer Maternal Grandmother   . Prostate cancer Maternal Grandfather   . Hyperlipidemia Father   . Kidney disease Maternal Grandmother   . Other Father     emotional illness    History   Social History  . Marital Status: Married    Spouse Name: N/A  . Number of Children: N/A  . Years of Education: N/A   Occupational History  . Not on file.   Social History Main Topics  . Smoking status: Never Smoker   . Smokeless tobacco: Never Used  . Alcohol Use: No  . Drug Use: No  . Sexual Activity: Not on file   Other Topics Concern  . Not on file   Social History Narrative    Review of Systems: See HPI, otherwise negative ROS  Physical Exam: BP 144/89 mmHg  Pulse 93  Temp(Src) 98.6 F (37 C) (Tympanic)  Resp 17  Ht  (1.6 m)  Wt 72.576 kg (160 lb)  BMI 28.35 kg/m2  SpO2 100% General:   Alert,  pleasant and cooperative in NAD Head:  Normocephalic and atraumatic. Neck:  Supple; no masses or thyromegaly. Lungs:  Clear throughout to auscultation.    Heart:  Regular rate and rhythm. Abdomen:  Soft, nontender and nondistended. Normal bowel sounds, without guarding, and without rebound.   Neurologic:  Alert and  oriented x4;  grossly normal neurologically.  Impression/Plan: Mallory Jones is here for an colonoscopy to be performed for screening and FH colon cancer  Risks, benefits, limitations, and alternatives regarding  colonoscopy have been  reviewed with the patient.  Questions have been answered.  All parties agreeable.   Lynnae Prude, MD  10/16/2014, 3:49 PM

## 2014-10-16 NOTE — Anesthesia Postprocedure Evaluation (Signed)
  Anesthesia Post-op Note  Patient: Mallory Jones  Procedure(s) Performed: Procedure(s): COLONOSCOPY WITH PROPOFOL (N/A)  Anesthesia type:General  Patient location: PACU  Post pain: Pain level controlled  Post assessment: Post-op Vital signs reviewed, Patient's Cardiovascular Status Stable, Respiratory Function Stable, Patent Airway and No signs of Nausea or vomiting  Post vital signs: Reviewed and stable  Last Vitals:  Filed Vitals:   10/16/14 1655  BP: 162/96  Pulse: 75  Temp:   Resp: 16    Level of consciousness: awake, alert  and patient cooperative  Complications: No apparent anesthesia complications

## 2014-10-19 ENCOUNTER — Encounter: Payer: Self-pay | Admitting: Unknown Physician Specialty

## 2014-10-20 ENCOUNTER — Other Ambulatory Visit: Payer: Self-pay | Admitting: Family Medicine

## 2014-10-20 ENCOUNTER — Telehealth: Payer: Self-pay | Admitting: Family Medicine

## 2014-10-20 DIAGNOSIS — Z0000 Encounter for general adult medical examination without abnormal findings: Secondary | ICD-10-CM

## 2014-10-20 DIAGNOSIS — E538 Deficiency of other specified B group vitamins: Secondary | ICD-10-CM

## 2014-10-20 NOTE — Telephone Encounter (Signed)
-----   Message from Alvina Chou sent at 10/20/2014 11:46 AM EDT ----- Regarding: Lab orders for Wednesday, 8.3.16 saw that this pt needs a vit B12, her Dr Is listed on my schedule as Bethann Punches??? Do you see anything that indicates lab work needed for this Dr?

## 2014-10-21 ENCOUNTER — Other Ambulatory Visit (INDEPENDENT_AMBULATORY_CARE_PROVIDER_SITE_OTHER): Payer: No Typology Code available for payment source

## 2014-10-21 DIAGNOSIS — Z0000 Encounter for general adult medical examination without abnormal findings: Secondary | ICD-10-CM | POA: Diagnosis not present

## 2014-10-21 DIAGNOSIS — E538 Deficiency of other specified B group vitamins: Secondary | ICD-10-CM

## 2014-10-21 LAB — CBC WITH DIFFERENTIAL/PLATELET
Basophils Absolute: 0 10*3/uL (ref 0.0–0.1)
Basophils Relative: 0.6 % (ref 0.0–3.0)
EOS PCT: 1.9 % (ref 0.0–5.0)
Eosinophils Absolute: 0.1 10*3/uL (ref 0.0–0.7)
HCT: 40 % (ref 36.0–46.0)
Hemoglobin: 13.4 g/dL (ref 12.0–15.0)
LYMPHS ABS: 1.8 10*3/uL (ref 0.7–4.0)
LYMPHS PCT: 23.8 % (ref 12.0–46.0)
MCHC: 33.6 g/dL (ref 30.0–36.0)
MCV: 85.5 fl (ref 78.0–100.0)
MONO ABS: 0.5 10*3/uL (ref 0.1–1.0)
Monocytes Relative: 7 % (ref 3.0–12.0)
NEUTROS ABS: 4.9 10*3/uL (ref 1.4–7.7)
Neutrophils Relative %: 66.7 % (ref 43.0–77.0)
Platelets: 263 10*3/uL (ref 150.0–400.0)
RBC: 4.68 Mil/uL (ref 3.87–5.11)
RDW: 13.9 % (ref 11.5–15.5)
WBC: 7.4 10*3/uL (ref 4.0–10.5)

## 2014-10-21 LAB — COMPREHENSIVE METABOLIC PANEL
ALK PHOS: 91 U/L (ref 39–117)
ALT: 14 U/L (ref 0–35)
AST: 15 U/L (ref 0–37)
Albumin: 4 g/dL (ref 3.5–5.2)
BUN: 11 mg/dL (ref 6–23)
CHLORIDE: 103 meq/L (ref 96–112)
CO2: 31 meq/L (ref 19–32)
Calcium: 9.6 mg/dL (ref 8.4–10.5)
Creatinine, Ser: 0.77 mg/dL (ref 0.40–1.20)
GFR: 87.36 mL/min (ref >60.00)
Glucose, Bld: 80 mg/dL (ref 70–99)
Potassium: 4 mEq/L (ref 3.5–5.1)
Sodium: 138 mEq/L (ref 135–145)
Total Bilirubin: 0.2 mg/dL (ref 0.2–1.2)
Total Protein: 7.1 g/dL (ref 6.0–8.3)

## 2014-10-21 LAB — LIPID PANEL
Cholesterol: 212 mg/dL — ABNORMAL HIGH (ref 0–200)
HDL: 40.9 mg/dL (ref >39.00)
LDL Cholesterol: 141 mg/dL — ABNORMAL HIGH (ref 0–99)
NONHDL: 171.09
TRIGLYCERIDES: 150 mg/dL — AB (ref 0.0–149.0)
Total CHOL/HDL Ratio: 5
VLDL: 30 mg/dL (ref 0.0–40.0)

## 2014-10-21 LAB — VITAMIN B12: VITAMIN B 12: 651 pg/mL (ref 211–911)

## 2014-10-21 LAB — TSH: TSH: 1.15 u[IU]/mL (ref 0.35–4.50)

## 2014-10-23 ENCOUNTER — Encounter: Payer: Self-pay | Admitting: *Deleted

## 2016-05-01 ENCOUNTER — Emergency Department: Payer: PRIVATE HEALTH INSURANCE

## 2016-05-01 ENCOUNTER — Observation Stay
Admit: 2016-05-01 | Discharge: 2016-05-01 | Disposition: A | Discharge status: Home / Self Care | Payer: PRIVATE HEALTH INSURANCE | Attending: Internal Medicine | Admitting: Internal Medicine

## 2016-05-01 ENCOUNTER — Observation Stay
Admission: EM | Admit: 2016-05-01 | Discharge: 2016-05-02 | Disposition: A | Discharge status: Home / Self Care | Payer: PRIVATE HEALTH INSURANCE | Attending: Internal Medicine | Admitting: Internal Medicine

## 2016-05-01 ENCOUNTER — Observation Stay: Payer: PRIVATE HEALTH INSURANCE

## 2016-05-01 ENCOUNTER — Encounter: Payer: Self-pay | Admitting: Emergency Medicine

## 2016-05-01 DIAGNOSIS — E785 Hyperlipidemia, unspecified: Secondary | ICD-10-CM | POA: Insufficient documentation

## 2016-05-01 DIAGNOSIS — I639 Cerebral infarction, unspecified: Secondary | ICD-10-CM

## 2016-05-01 DIAGNOSIS — I10 Essential (primary) hypertension: Secondary | ICD-10-CM | POA: Insufficient documentation

## 2016-05-01 DIAGNOSIS — G459 Transient cerebral ischemic attack, unspecified: Principal | ICD-10-CM | POA: Insufficient documentation

## 2016-05-01 DIAGNOSIS — E876 Hypokalemia: Secondary | ICD-10-CM | POA: Insufficient documentation

## 2016-05-01 DIAGNOSIS — G4733 Obstructive sleep apnea (adult) (pediatric): Secondary | ICD-10-CM | POA: Insufficient documentation

## 2016-05-01 DIAGNOSIS — F329 Major depressive disorder, single episode, unspecified: Secondary | ICD-10-CM | POA: Insufficient documentation

## 2016-05-01 DIAGNOSIS — Z7982 Long term (current) use of aspirin: Secondary | ICD-10-CM | POA: Insufficient documentation

## 2016-05-01 DIAGNOSIS — R202 Paresthesia of skin: Secondary | ICD-10-CM

## 2016-05-01 DIAGNOSIS — F418 Other specified anxiety disorders: Secondary | ICD-10-CM | POA: Insufficient documentation

## 2016-05-01 DIAGNOSIS — G451 Carotid artery syndrome (hemispheric): Secondary | ICD-10-CM | POA: Diagnosis not present

## 2016-05-01 HISTORY — DX: Anxiety disorder, unspecified: F41.9

## 2016-05-01 HISTORY — DX: Essential (primary) hypertension: I10

## 2016-05-01 LAB — COMPREHENSIVE METABOLIC PANEL
ALK PHOS: 79 U/L (ref 38–126)
ALT: 12 U/L — ABNORMAL LOW (ref 14–54)
AST: 20 U/L (ref 15–41)
Albumin: 4.6 g/dL (ref 3.5–5.0)
Anion gap: 10 (ref 5–15)
BILIRUBIN TOTAL: 0.4 mg/dL (ref 0.3–1.2)
BUN: 11 mg/dL (ref 6–20)
CALCIUM: 9.8 mg/dL (ref 8.9–10.3)
CO2: 26 mmol/L (ref 22–32)
CREATININE: 0.77 mg/dL (ref 0.44–1.00)
Chloride: 101 mmol/L (ref 101–111)
GFR calc non Af Amer: >60 mL/min (ref >60)
Glucose, Bld: 104 mg/dL — ABNORMAL HIGH (ref 65–99)
Potassium: 3.2 mmol/L — ABNORMAL LOW (ref 3.5–5.1)
SODIUM: 137 mmol/L (ref 135–145)
TOTAL PROTEIN: 8.6 g/dL — AB (ref 6.5–8.1)

## 2016-05-01 LAB — URINALYSIS, ROUTINE W REFLEX MICROSCOPIC
Bilirubin Urine: NEGATIVE
Glucose, UA: NEGATIVE mg/dL
Ketones, ur: NEGATIVE mg/dL
Leukocytes, UA: NEGATIVE
NITRITE: NEGATIVE
Protein, ur: NEGATIVE mg/dL
RBC / HPF: NONE SEEN RBC/hpf (ref 0–5)
SPECIFIC GRAVITY, URINE: 1.002 — AB (ref 1.005–1.030)
WBC, UA: NONE SEEN WBC/hpf (ref 0–5)
pH: 8 (ref 5.0–8.0)

## 2016-05-01 LAB — APTT: aPTT: 29 seconds (ref 24–36)

## 2016-05-01 LAB — DIFFERENTIAL
Basophils Absolute: 0 10*3/uL (ref 0–0.1)
Basophils Relative: 0 %
Eosinophils Absolute: 0 10*3/uL (ref 0–0.7)
Eosinophils Relative: 0 %
LYMPHS ABS: 0.8 10*3/uL — AB (ref 1.0–3.6)
LYMPHS PCT: 8 %
MONO ABS: 0.4 10*3/uL (ref 0.2–0.9)
MONOS PCT: 4 %
NEUTROS ABS: 8.9 10*3/uL — AB (ref 1.4–6.5)
Neutrophils Relative %: 88 %

## 2016-05-01 LAB — CBC
HCT: 34.7 % — ABNORMAL LOW (ref 35.0–47.0)
HEMOGLOBIN: 11.5 g/dL — AB (ref 12.0–16.0)
MCH: 23.5 pg — AB (ref 26.0–34.0)
MCHC: 33.1 g/dL (ref 32.0–36.0)
MCV: 70.9 fL — AB (ref 80.0–100.0)
PLATELETS: 271 10*3/uL (ref 150–440)
RBC: 4.89 MIL/uL (ref 3.80–5.20)
RDW: 17.7 % — ABNORMAL HIGH (ref 11.5–14.5)
WBC: 10.2 10*3/uL (ref 3.6–11.0)

## 2016-05-01 LAB — GLUCOSE, CAPILLARY: Glucose-Capillary: 112 mg/dL — ABNORMAL HIGH (ref 65–99)

## 2016-05-01 LAB — HCG, QUANTITATIVE, PREGNANCY: HCG, BETA CHAIN, QUANT, S: 1 m[IU]/mL (ref <5)

## 2016-05-01 LAB — ETHANOL: Alcohol, Ethyl (B): <5 mg/dL (ref <5)

## 2016-05-01 LAB — TROPONIN I

## 2016-05-01 LAB — PROTIME-INR
INR: 0.92
Prothrombin Time: 12.3 seconds (ref 11.4–15.2)

## 2016-05-01 MED ORDER — SENNOSIDES-DOCUSATE SODIUM 8.6-50 MG PO TABS: 1.0000 | ORAL_TABLET | Freq: Every evening | ORAL | Status: DC | PRN

## 2016-05-01 MED ORDER — SODIUM CHLORIDE 0.9 % IV SOLN
30.0000 meq | Freq: Once | INTRAVENOUS | Status: AC
  Administered 2016-05-01: 17:00:00 30 meq via INTRAVENOUS
  Filled 2016-05-01 (×2): qty 15

## 2016-05-01 MED ORDER — ACETAMINOPHEN 160 MG/5ML PO SOLN: 650.0000 mg | ORAL | Status: DC | PRN

## 2016-05-01 MED ORDER — ACETAMINOPHEN 650 MG RE SUPP: 650.0000 mg | RECTAL | Status: DC | PRN

## 2016-05-01 MED ORDER — ENOXAPARIN SODIUM 40 MG/0.4ML ~~LOC~~ SOLN
40.0000 mg | SUBCUTANEOUS | Status: DC
  Administered 2016-05-01: 40 mg via SUBCUTANEOUS
  Filled 2016-05-01: qty 0.4

## 2016-05-01 MED ORDER — CLOMIPRAMINE HCL 25 MG PO CAPS: 12.5000 mg | ORAL_CAPSULE | Freq: Every day | ORAL | Status: DC

## 2016-05-01 MED ORDER — ACETAMINOPHEN 325 MG PO TABS
650.0000 mg | ORAL_TABLET | ORAL | Status: DC | PRN
  Administered 2016-05-01: 16:00:00 650 mg via ORAL
  Filled 2016-05-01: qty 2

## 2016-05-01 MED ORDER — CLONAZEPAM 0.5 MG PO TABS
0.2500 mg | ORAL_TABLET | Freq: Three times a day (TID) | ORAL | Status: DC | PRN
  Administered 2016-05-01: 23:00:00 0.25 mg via ORAL
  Filled 2016-05-01: qty 1

## 2016-05-01 MED ORDER — STROKE: EARLY STAGES OF RECOVERY BOOK
Freq: Once | Status: AC
  Administered 2016-05-01: 15:00:00

## 2016-05-01 MED ORDER — ASPIRIN EC 81 MG PO TBEC
81.0000 mg | DELAYED_RELEASE_TABLET | Freq: Once | ORAL | Status: AC
  Administered 2016-05-01: 81 mg via ORAL
  Filled 2016-05-01: qty 1

## 2016-05-01 MED ORDER — FLUVOXAMINE MALEATE 50 MG PO TABS
100.0000 mg | ORAL_TABLET | Freq: Every day | ORAL | Status: DC
  Administered 2016-05-01: 100 mg via ORAL
  Filled 2016-05-01: qty 2
  Filled 2016-05-01: qty 1

## 2016-05-01 NOTE — ED Provider Notes (Signed)
[facility] Emergency Department Provider Note   ____________________________________________   First MD Initiated Contact with Patient 05/01/16 [phone]     (approximate)  I have reviewed the triage vital signs and the nursing notes.   HISTORY  Chief Complaint Left arm tingling   HPI Mallory Jones is a 46 y.o. female awoke with a tingling sensation in her left arm, left hand felt a little slow, and also felt like she didn't want to talk much. She woke around 8 AM, she was last seen well at about 2 in the morning when she fed her cat  No chest pain. No headache. No facial droop. No trouble walking. No weakness in her arms or legs. She has not had any slurred speech, but just describes that she just felt like she doesn't want talk as much as she normally would. Denies any chance of pregnancy  Past Medical History:  Diagnosis Date  . History of depression   . History of hay fever   . History of hyperlipidemia   . History of sleep apnea   . History of UTI   . Hypertension     Patient Active Problem List   Diagnosis Date Noted  . Encounter for screening mammogram for breast cancer 07/07/2014  . Family history of colon cancer 07/07/2014  . Colon cancer screening 07/07/2014  . Fatigue 09/22/2013  . PMDD (premenstrual dysphoric disorder) 09/22/2013  . Encounter for routine gynecological examination 07/04/2013  . Obstructive sleep apnea 07/04/2013  . Allergic rhinitis 07/04/2013  . Routine general medical examination at a health care facility 07/04/2013  . Depression with anxiety 07/04/2013  . Ankle swelling 07/04/2013  . Frequent UTI 07/04/2013    Past Surgical History:  Procedure Laterality Date  . CESAREAN SECTION  2005  . CESAREAN SECTION  2008  . COLONOSCOPY WITH PROPOFOL N/A 10/16/2014   Procedure: COLONOSCOPY WITH PROPOFOL;  Surgeon: Scot Jun, MD;  Location: [facility] ENDOSCOPY;  Service: Endoscopy;  Laterality: N/A;  . DILATION AND  CURETTAGE OF UTERUS  2003    Prior to Admission medications   Medication Sig Start Date End Date Taking? Authorizing Provider  busPIRone (BUSPAR) 15 MG tablet Take 15 mg by mouth 2 (two) times daily.  06/24/14  Yes Historical Provider, MD  clomiPRAMINE (ANAFRANIL) 25 MG capsule Take 12.5 mg by mouth at bedtime.   Yes Historical Provider, MD  clonazePAM (KLONOPIN) 0.5 MG tablet Take 0.25 mg by mouth 3 (three) times daily as needed for anxiety.    Yes Historical Provider, MD  fluvoxaMINE (LUVOX) 100 MG tablet Take 100 mg by mouth at bedtime.    Yes Historical Provider, MD  lisinopril-hydrochlorothiazide (PRINZIDE,ZESTORETIC) 20-25 MG tablet Take 1 tablet by mouth daily. 05/01/16 05/01/17 Yes Historical Provider, MD    Allergies Patient has no known allergies.  Family History  Problem Relation Age of Onset  . Alcohol abuse Father   . Colon cancer Father   . Hyperlipidemia Father   . Other Father     emotional illness  . Ovarian cancer Maternal Grandmother   . Kidney disease Maternal Grandmother   . Prostate cancer Maternal Grandfather     Social History Social History  Substance Use Topics  . Smoking status: Never Smoker  . Smokeless tobacco: Never Used  . Alcohol use No    Review of Systems Constitutional: No fever/chills Eyes: No visual changes.No double vision ENT: No sore throat. Cardiovascular: Denies chest pain. Respiratory: Denies shortness of breath. Gastrointestinal: No abdominal  pain.  No nausea, no vomiting.  No diarrhea.  No constipation. Genitourinary: Negative for dysuria. Musculoskeletal: Negative for back pain. Skin: Negative for rash. Neurological: Negative for headaches, no focal weakness.  10-point ROS otherwise negative.  ____________________________________________   PHYSICAL EXAM:  VITAL SIGNS: ED Triage Vitals [05/01/16 0926]  Enc Vitals Group     BP (!) 148/89     Pulse Rate 98     Resp 18     Temp 98.7 F (37.1 C)     Temp Source Oral      SpO2 100 %     Weight 153 lb (69.4 kg)     Height 5\' 3"  (1.6 m)     Head Circumference      Peak Flow      Pain Score      Pain Loc      Pain Edu?      Excl. in GC?     Constitutional: Alert and oriented. Well appearing and in no acute distress. Eyes: Conjunctivae are normal. PERRL. EOMI. Head: Atraumatic. Nose: No congestion/rhinnorhea. Mouth/Throat: Mucous membranes are moist.  Oropharynx non-erythematous. Neck: No stridor.   Cardiovascular: Normal rate, regular rhythm. Grossly normal heart sounds.  Good peripheral circulation. Respiratory: Normal respiratory effort.  No retractions. Lungs CTAB. Gastrointestinal: Soft and nontender. No distention. No abdominal bruits. No CVA tenderness. Musculoskeletal: No lower extremity tenderness nor edema.  No joint effusions. Neurologic:  Normal speech and language. No gross focal neurologic deficits are appreciated.  NIH score equals 1, performed by me at bedside. The patient has no pronator drift. The patient has normal cranial nerve exam. Extraocular movements are normal. Visual fields are normal. Patient has 5 out of 5 strength in all extremities. There is no numbness or gross, acute sensory abnormality in the extremities bilaterally except she subjectively reports a tingling feeling over the left face, left hand however on objective testing she seems to discriminate's touch in this area the same as she does on the right side. No speech disturbance. No dysarthria. No aphasia. No ataxia. Normal finger nose finger bilat. Patient speaking in full and clear sentences.   Skin:  Skin is warm, dry and intact. No rash noted. Psychiatric: Mood and affect are normal. Speech and behavior are normal.  ____________________________________________   LABS (all labs ordered are listed, but only abnormal results are displayed)  Labs Reviewed  CBC - Abnormal; Notable for the following:       Result Value   Hemoglobin 11.5 (*)    HCT 34.7  (*)    MCV 70.9 (*)    MCH 23.5 (*)    RDW 17.7 (*)    All other components within normal limits  DIFFERENTIAL - Abnormal; Notable for the following:    Neutro Abs 8.9 (*)    Lymphs Abs 0.8 (*)    All other components within normal limits  COMPREHENSIVE METABOLIC PANEL - Abnormal; Notable for the following:    Potassium 3.2 (*)    Glucose, Bld 104 (*)    Total Protein 8.6 (*)    ALT 12 (*)    All other components within normal limits  GLUCOSE, CAPILLARY - Abnormal; Notable for the following:    Glucose-Capillary 112 (*)    All other components within normal limits  ETHANOL  PROTIME-INR  APTT  TROPONIN I  HCG, QUANTITATIVE, PREGNANCY  RAPID URINE DRUG SCREEN, HOSP PERFORMED  URINALYSIS, ROUTINE W REFLEX MICROSCOPIC  I-STAT CHEM 8, ED   ____________________________________________  EKG  ED ECG REPORT I, Kaci Dillie, the attending physician, personally viewed and interpreted this ECG.  Date: 05/01/2016 EKG Time: 926am Rate: 100 Rhythm: Sinus tachycardia QRS Axis: normal Intervals: normal ST/T Wave abnormalities: normal Conduction Disturbances: none Narrative Interpretation: Sinus tachycardia, no acute ischemia noted ____________________________________________  RADIOLOGY  Ct Head Code Stroke W/o Cm  Addendum Date: 05/01/2016   ADDENDUM REPORT: 05/01/2016 10:35 ADDENDUM: Study discussed by telephone with Dr. Sharyn CreamerMARK Juelle Dickmann on 05/01/2016 at 1017 hours. Electronically Signed   By: Odessa FlemingH  Hall M.D.   On: 05/01/2016 10:35   Result Date: 05/01/2016 CLINICAL DATA:  Code stroke. 46 year old female with left side numbness and tingling discovered at 0615 hours. Initial encounter. EXAM: CT HEAD WITHOUT CONTRAST TECHNIQUE: Contiguous axial images were obtained from the base of the skull through the vertex without intravenous contrast. COMPARISON:  Brain MRI 12/07/2012. FINDINGS: Brain: Cerebral volume remains normal. No midline shift, ventriculomegaly, mass effect, evidence of mass lesion,  intracranial hemorrhage or evidence of cortically based acute infarction. Gray-white matter differentiation is within normal limits throughout the brain. Vascular: No suspicious intracranial vascular hyperdensity. Skull: No osseous abnormality identified. Sinuses/Orbits: Clear. Other: Visualized orbits and scalp soft tissues are within normal limits. ASPECTS [facility]([place] Stroke Program Early CT Score) - Ganglionic level infarction (caudate, lentiform nuclei, internal capsule, insula, M1-M3 cortex): 7 - Supraganglionic infarction (M4-M6 cortex): 3 Total score (0-10 with 10 being normal): 10 IMPRESSION: 1. Normal noncontrast CT appearance of the brain. 2. ASPECTS is 10 Electronically Signed: By: Odessa FlemingH  Hall M.D. On: 05/01/2016 10:05    ____________________________________________   PROCEDURES  Procedure(s) performed: None  Procedures  Critical Care performed: Yes, see critical care note(s)  CRITICAL CARE Performed by: Sharyn CreamerQUALE, Amayrani Bennick   Total critical care time: 39 minutes  Critical care time was exclusive of separately billable procedures and treating other patients.  Critical care was necessary to treat or prevent imminent or life-threatening deterioration.  Critical care was time spent personally by me on the following activities: development of treatment plan with patient and/or surrogate as well as nursing, discussions with consultants, evaluation of patient's response to treatment, examination of patient, obtaining history from patient or surrogate, ordering and performing treatments and interventions, ordering and review of laboratory studies, ordering and review of radiographic studies, pulse oximetry and re-evaluation of patient's condition.  Patient presents with acute neurologic symptoms starting less than 8 hours ago. Patient was seen and evaluated emergently for potential acute ischemic stroke or other acute neurologic deficit. Patient was called as a "code stroke" meaning the window with  less than 8 hours of symptoms and neurologic unilateral symptomatology ____________________________________________   INITIAL IMPRESSION / ASSESSMENT AND PLAN / ED COURSE  Pertinent labs & imaging results that were available during my care of the patient were reviewed by me and considered in my medical decision making (see chart for details).  Patient last seen well 2 AM, woke up this morning with sudden onset of feeling tingling in her left arm, left face, and as though her left hand seems strange and her speech seemed less wanting to talk in normal  Clinically, her NIH exam at this point demonstrates a 1 given the patient's subjective report of paresthesias over the left face and arm. She does not appear a TPA candidate, outside the window, and essentially pushing the limits of a window for any intravascular therapy as well given onset around 2 AM  No acute cardiac or pulmonary symptoms. Minimal previous medical illnesses ----------------------------------------- 10:05 AM on 05/01/2016 -----------------------------------------  Dr. Loretha Brasil performing consults in the ER      ____________________________________________   FINAL CLINICAL IMPRESSION(S) / ED DIAGNOSES  Final diagnoses:  Left hand paresthesia      NEW MEDICATIONS STARTED DURING THIS VISIT:  New Prescriptions   No medications on file     Note:  This document was prepared using Dragon voice recognition software and may include unintentional dictation errors.     Sharyn Creamer, MD 05/01/16 1104

## 2016-05-01 NOTE — Progress Notes (Signed)
Notified Dr Amado CoeGouru that at 1645 pt's HR up to 140'-150's while up to the bathroom.Pt was asymptomatic. HR down to 100-110 ST once back to bed. No new orders received.

## 2016-05-01 NOTE — H&P (Signed)
[facility] [place] at [facility]   PATIENT NAME: Mallory FlockLisa Jones    MR#:  [id number]  DATE OF BIRTH:  1971/01/25  DATE OF ADMISSION:  05/01/2016  PRIMARY CARE PHYSICIAN: Danella PentonMark F Miller, MD   REQUESTING/REFERRING PHYSICIAN:Quale  CHIEF COMPLAINT:   Left facial numbness HISTORY OF PRESENT ILLNESS:  Mallory Jones  is a 46 y.o. female with a known history of Hypertension, sleep apnea, depression, hyperlipidemia is presenting to the ED with a chief complaint of left facial numbness. Last well-known at around 5 AM today but woke up at 8 AM with left facial and left-sided numbness that has improved. CT head is negative. Denies any headache or blurry vision. No similar complaints in the past. Denies any speech problem. Husband at bedside  PAST MEDICAL HISTORY:   Past Medical History:  Diagnosis Date  . History of depression   . History of hay fever   . History of hyperlipidemia   . History of sleep apnea   . History of UTI   . Hypertension     PAST SURGICAL HISTOIRY:   Past Surgical History:  Procedure Laterality Date  . CESAREAN SECTION  2005  . CESAREAN SECTION  2008  . COLONOSCOPY WITH PROPOFOL N/A 10/16/2014   Procedure: COLONOSCOPY WITH PROPOFOL;  Surgeon: Scot Junobert T Elliott, MD;  [facility];  Service: Endoscopy;  Laterality: N/A;  . DILATION AND CURETTAGE OF UTERUS  2003    SOCIAL HISTORY:   Social History  Substance Use Topics  . Smoking status: Never Smoker  . Smokeless tobacco: Never Used  . Alcohol use No    FAMILY HISTORY:   Family History  Problem Relation Age of Onset  . Alcohol abuse Father   . Colon cancer Father   . Hyperlipidemia Father   . Other Father     emotional illness  . Ovarian cancer Maternal Grandmother   . Kidney disease Maternal Grandmother   . Prostate cancer Maternal Grandfather     DRUG ALLERGIES:  No Known Allergies  REVIEW OF SYSTEMS:  CONSTITUTIONAL: No fever, fatigue or weakness.  EYES: No  blurred or double vision.  EARS, NOSE, AND THROAT: No tinnitus or ear pain.  RESPIRATORY: No cough, shortness of breath, wheezing or hemoptysis.  CARDIOVASCULAR: No chest pain, orthopnea, edema.  GASTROINTESTINAL: No nausea, vomiting, diarrhea or abdominal pain.  GENITOURINARY: No dysuria, hematuria.  ENDOCRINE: No polyuria, nocturia,  HEMATOLOGY: No anemia, easy bruising or bleeding SKIN: No rash or lesion. MUSCULOSKELETAL: No joint pain or arthritis.   NEUROLOGIC: Reporting left facial and left upper extremity numbness  which is improving, deniesweakness.  PSYCHIATRY: No anxiety or depression.   MEDICATIONS AT HOME:   Prior to Admission medications   Medication Sig Start Date End Date Taking? Authorizing Provider  busPIRone (BUSPAR) 15 MG tablet Take 15 mg by mouth 2 (two) times daily.  06/24/14  Yes Historical Provider, MD  clomiPRAMINE (ANAFRANIL) 25 MG capsule Take 12.5 mg by mouth at bedtime.   Yes Historical Provider, MD  clonazePAM (KLONOPIN) 0.5 MG tablet Take 0.25 mg by mouth 3 (three) times daily as needed for anxiety.    Yes Historical Provider, MD  fluvoxaMINE (LUVOX) 100 MG tablet Take 100 mg by mouth at bedtime.    Yes Historical Provider, MD  lisinopril-hydrochlorothiazide (PRINZIDE,ZESTORETIC) 20-25 MG tablet Take 1 tablet by mouth daily. 05/01/16 05/01/17 Yes Historical Provider, MD      VITAL SIGNS:  Blood pressure (!) 159/96, pulse (!) 102, temperature 98.7 F (  37.1 C), temperature source Oral, resp. rate 13, height 5\' 3"  (1.6 m), weight 69.4 kg (153 lb), last menstrual period 04/24/2016, SpO2 100 %.  PHYSICAL EXAMINATION:  GENERAL:  46 y.o.-year-old patient lying in the bed with no acute distress.  EYES: Pupils equal, round, reactive to light and accommodation. No scleral icterus. Extraocular muscles intact.  HEENT: Head atraumatic, normocephalic. Oropharynx and nasopharynx clear.  NECK:  Supple, no jugular venous distention. No thyroid enlargement, no tenderness.   LUNGS: Normal breath sounds bilaterally, no wheezing, rales,rhonchi or crepitation. No use of accessory muscles of respiration.  CARDIOVASCULAR: S1, S2 normal. No murmurs, rubs, or gallops.  ABDOMEN: Soft, nontender, nondistended. Bowel sounds present. No organomegaly or mass.  EXTREMITIES: No pedal edema, cyanosis, or clubbing.  NEUROLOGIC: Cranial nerves II through XII are intact. Muscle strength 5/5 in all extremities. Sensation intact. Gait not checked.  PSYCHIATRIC: The patient is alert and oriented x 3.  SKIN: No obvious rash, lesion, or ulcer.   LABORATORY PANEL:   CBC  Recent Labs Lab 05/01/16 0959  WBC 10.2  HGB 11.5*  HCT 34.7*  PLT 271   ------------------------------------------------------------------------------------------------------------------  Chemistries   Recent Labs Lab 05/01/16 0959  NA 137  K 3.2*  CL 101  CO2 26  GLUCOSE 104*  BUN 11  CREATININE 0.77  CALCIUM 9.8  AST 20  ALT 12*  ALKPHOS 79  BILITOT 0.4   ------------------------------------------------------------------------------------------------------------------  Cardiac Enzymes  Recent Labs Lab 05/01/16 0959  TROPONINI <0.03   ------------------------------------------------------------------------------------------------------------------  RADIOLOGY:  Ct Head Code Stroke W/o Cm  Addendum Date: 05/01/2016   ADDENDUM REPORT: 05/01/2016 10:35 ADDENDUM: Study discussed by telephone with Dr. Sharyn Creamer on 05/01/2016 at 1017 hours. Electronically Signed   By: Odessa Fleming M.D.   On: 05/01/2016 10:35   Result Date: 05/01/2016 CLINICAL DATA:  Code stroke. 46 year old female with left side numbness and tingling discovered at 0615 hours. Initial encounter. EXAM: CT HEAD WITHOUT CONTRAST TECHNIQUE: Contiguous axial images were obtained from the base of the skull through the vertex without intravenous contrast. COMPARISON:  Brain MRI 12/07/2012. FINDINGS: Brain: Cerebral volume remains normal.  No midline shift, ventriculomegaly, mass effect, evidence of mass lesion, intracranial hemorrhage or evidence of cortically based acute infarction. Gray-white matter differentiation is within normal limits throughout the brain. Vascular: No suspicious intracranial vascular hyperdensity. Skull: No osseous abnormality identified. Sinuses/Orbits: Clear. Other: Visualized orbits and scalp soft tissues are within normal limits. ASPECTS [facility] Stroke Program Early CT Score) - Ganglionic level infarction (caudate, lentiform nuclei, internal capsule, insula, M1-M3 cortex): 7 - Supraganglionic infarction (M4-M6 cortex): 3 Total score (0-10 with 10 being normal): 10 IMPRESSION: 1. Normal noncontrast CT appearance of the brain. 2. ASPECTS is 10 Electronically Signed: By: Odessa Fleming M.D. On: 05/01/2016 10:05    EKG:   Orders placed or performed during the hospital encounter of 05/01/16  . EKG 12-Lead  . EKG 12-Lead  . ED EKG  . ED EKG    IMPRESSION AND PLAN:   Laykin Rainone  is a 46 y.o. female with a known history of Hypertension, sleep apnea, depression, hyperlipidemia is presenting to the ED with a chief complaint of left facial numbness. Last well-known at around 5 AM today but woke up at 8 AM with left facial and left-sided numbness that has improved. CT head is negative.   #TIA Admit to MedSurg unit CT head is negative Will get MRI and MRA of the brain without contrast Check fasting lipid panel, hemoglobin A1c Complete stroke  workup with carotid Dopplers and 2-D echocardiogram Aspirin 81 mg by mouth once daily Neuro checks PT, OT and speech consult Nothing by mouth until bedside swallow evaluation Follow-up with neurology Dr. Lonia Mad  he is aware  #Hypokalemia replete potassium and check potassium and magnesium levels in a.m.  #Obstructive sleep apnea continue home CPAP husband will bring the equipment  #Essential hypertension Currently patient is on by mouth Will resume her home  medication lisinopril hydrochlorothiazide once patient passed bedside swallow evaluation   GI and DVT prophylaxis  All the records are reviewed and case discussed with ED provider. Management plans discussed with the patient, family and they are in agreement.  CODE STATUS: fc , husband is the healthcare power of attorney  TOTAL TIME TAKING CARE OF THIS PATIENT: .   Note: This dictation was prepared with Dragon dictation along with smaller phrase technology. Any transcriptional errors that result from this process are unintentional.  Ramonita Lab M.D on 05/01/2016 at 11:24 AM  Between 7am to 6pm - Pager - [phone]  After 6pm go to [email or web address] - password [email or web address] [facility]  [place] [facility]  Office  [phone]  CC: Primary care physician; Danella Penton, MD

## 2016-05-01 NOTE — ED Notes (Signed)
Patient transported to CT 

## 2016-05-01 NOTE — Progress Notes (Signed)
OT Cancellation Note  Patient Details Name: Lorriane ShireLisa P Rowe MRN: [id number] DOB: 17-Nov-1970   Cancelled Treatment:    Reason Eval/Treat Not Completed: Patient at procedure or test/ unavailable (Pt. going to ultrasound/testing.  )  Olegario MessierElaine Kairi Tufo, MS, OTR/L 05/01/2016, 3:43 PM

## 2016-05-01 NOTE — ED Notes (Signed)
CODE STROKE CALLED TO [id number] 

## 2016-05-01 NOTE — Consult Note (Signed)
Referring Physician: Dr. Fanny BienQuale     Chief Complaint: L facial numbness   HPI: Mallory Jones is an 46 y.o. female with hx of anxiety last known well about 5AM, woke up at 8 AM with L facial and L sided numbness that has improved. Pt now only complains of L facial numbness but it splits midline.  NIHSS is 1  Date last known well: Date: 05/01/2016 Time last known well: Time: 05:00 tPA Given: No: NIHSS 1  Past Medical History:  Diagnosis Date  . History of depression   . History of hay fever   . History of hyperlipidemia   . History of sleep apnea   . History of UTI   . Hypertension     Past Surgical History:  Procedure Laterality Date  . CESAREAN SECTION  2005  . CESAREAN SECTION  2008  . COLONOSCOPY WITH PROPOFOL N/A 10/16/2014   Procedure: COLONOSCOPY WITH PROPOFOL;  Surgeon: Scot Junobert T Elliott, MD;  [facility];  Service: Endoscopy;  Laterality: N/A;  . DILATION AND CURETTAGE OF UTERUS  2003    Family History  Problem Relation Age of Onset  . Alcohol abuse Father   . Colon cancer Father   . Hyperlipidemia Father   . Other Father     emotional illness  . Ovarian cancer Maternal Grandmother   . Kidney disease Maternal Grandmother   . Prostate cancer Maternal Grandfather    Social History:  reports that she has never smoked. She has never used smokeless tobacco. She reports that she does not drink alcohol or use drugs.  Allergies: No Known Allergies  Medications: I have reviewed the patient's current medications.  ROS: History obtained from the patient  General ROS: negative for - chills, fatigue, fever, night sweats, weight gain or weight loss Psychological ROS: anxiety  Ophthalmic ROS: negative for - blurry vision, double vision, eye pain or loss of vision ENT ROS: negative for - epistaxis, nasal discharge, oral lesions, sore throat, tinnitus or vertigo Allergy and Immunology ROS: negative for - hives or itchy/watery eyes Hematological and Lymphatic ROS:  negative for - bleeding problems, bruising or swollen lymph nodes Endocrine ROS: negative for - galactorrhea, hair pattern changes, polydipsia/polyuria or temperature intolerance Respiratory ROS: negative for - cough, hemoptysis, shortness of breath or wheezing Cardiovascular ROS: negative for - chest pain, dyspnea on exertion, edema or irregular heartbeat Gastrointestinal ROS: negative for - abdominal pain, diarrhea, hematemesis, nausea/vomiting or stool incontinence Genito-Urinary ROS: negative for - dysuria, hematuria, incontinence or urinary frequency/urgency Musculoskeletal ROS: negative for - joint swelling or muscular weakness Neurological ROS: as noted in HPI Dermatological ROS: negative for rash and skin lesion changes  Physical Examination: Blood pressure (!) 159/96, pulse (!) 102, temperature 98.7 F (37.1 C), temperature source Oral, resp. rate 13, height 5\' 3"  (1.6 m), weight 69.4 kg (153 lb), last menstrual period 04/24/2016, SpO2 100 %.  HEENT-  Normocephalic, no lesions, without obvious abnormality.  Normal external eye and conjunctiva.  Normal TM's bilaterally.  Normal auditory canals and external ears. Normal external nose, mucus membranes and septum.  Normal pharynx. Cardiovascular- regular rate and rhythm, S1, S2 normal, no murmur, click, rub or gallop, pulses palpable throughout   Lungs- Heart exam - S1, S2 normal, no murmur, no gallop, rate regular Abdomen- soft, non-tender; bowel sounds normal; no masses,  no organomegaly Extremities- less then 2 second capillary refill Lymph-no adenopathy palpable Musculoskeletal-no joint tenderness, deformity or swelling Skin-warm and dry, no hyperpigmentation, vitiligo, or suspicious lesions  Neurological Examination   Mental Status: Alert, oriented, thought content appropriate.  Speech fluent without evidence of aphasia.  Able to follow 3 step commands without difficulty. Cranial Nerves: II: Discs flat bilaterally; Visual fields  grossly normal, pupils equal, round, reactive to light and accommodation III,IV, VI: ptosis not present, extra-ocular motions intact bilaterally V,VII: smile symmetric, facial light touch sensation normal bilaterally VIII: hearing normal bilaterally IX,X: gag reflex present XI: bilateral shoulder shrug XII: midline tongue extension Motor: Right : Upper extremity   5/5    Left:     Upper extremity   5/5  Lower extremity   5/5     Lower extremity   5/5 Tone and bulk:normal tone throughout; no atrophy noted Sensory: decreased L side of face Deep Tendon Reflexes: 1+ and symmetric throughout Plantars: Right: downgoing   Left: downgoing Cerebellar: normal finger-to-nose, normal rapid alternating movements and normal heel-to-shin test Gait: not tested       Laboratory Studies:  Basic Metabolic Panel:  Recent Labs Lab 05/01/16 0959  NA 137  K 3.2*  CL 101  CO2 26  GLUCOSE 104*  BUN 11  CREATININE 0.77  CALCIUM 9.8    Liver Function Tests:  Recent Labs Lab 05/01/16 0959  AST 20  ALT 12*  ALKPHOS 79  BILITOT 0.4  PROT 8.6*  ALBUMIN 4.6   No results for input(s): LIPASE, AMYLASE in the last 168 hours. No results for input(s): AMMONIA in the last 168 hours.  CBC:  Recent Labs Lab 05/01/16 0959  WBC 10.2  NEUTROABS 8.9*  HGB 11.5*  HCT 34.7*  MCV 70.9*  PLT 271    Cardiac Enzymes:  Recent Labs Lab 05/01/16 0959  TROPONINI <0.03    BNP: Invalid input(s): POCBNP  CBG:  Recent Labs Lab 05/01/16 1000  GLUCAP 112*    Microbiology: No results found for this or any previous visit.  Coagulation Studies:  Recent Labs  05/01/16 0959  LABPROT 12.3  INR 0.92    Urinalysis: No results for input(s): COLORURINE, LABSPEC, PHURINE, GLUCOSEU, HGBUR, BILIRUBINUR, KETONESUR, PROTEINUR, UROBILINOGEN, NITRITE, LEUKOCYTESUR in the last 168 hours.  Invalid input(s): APPERANCEUR  Lipid Panel:    Component Value Date/Time   CHOL 212 (H) 10/21/2014 1127    TRIG 150.0 (H) 10/21/2014 1127   HDL 40.90 10/21/2014 1127   CHOLHDL 5 10/21/2014 1127   VLDL 30.0 10/21/2014 1127   LDLCALC 141 (H) 10/21/2014 1127    HgbA1C: No results found for: HGBA1C  Urine Drug Screen:  No results found for: LABOPIA, COCAINSCRNUR, LABBENZ, AMPHETMU, THCU, LABBARB  Alcohol Level:  Recent Labs Lab 05/01/16 0959  ETH <5    Other results: EKG: normal EKG, normal sinus rhythm, unchanged from previous tracings.  Imaging: Ct Head Code Stroke W/o Cm  Addendum Date: 05/01/2016   ADDENDUM REPORT: 05/01/2016 10:35 ADDENDUM: Study discussed by telephone with Dr. Sharyn Creamer on 05/01/2016 at 1017 hours. Electronically Signed   By: Odessa Fleming M.D.   On: 05/01/2016 10:35   Result Date: 05/01/2016 CLINICAL DATA:  Code stroke. 46 year old female with left side numbness and tingling discovered at 0615 hours. Initial encounter. EXAM: CT HEAD WITHOUT CONTRAST TECHNIQUE: Contiguous axial images were obtained from the base of the skull through the vertex without intravenous contrast. COMPARISON:  Brain MRI 12/07/2012. FINDINGS: Brain: Cerebral volume remains normal. No midline shift, ventriculomegaly, mass effect, evidence of mass lesion, intracranial hemorrhage or evidence of cortically based acute infarction. Gray-white matter differentiation is within normal limits throughout the brain.  Vascular: No suspicious intracranial vascular hyperdensity. Skull: No osseous abnormality identified. Sinuses/Orbits: Clear. Other: Visualized orbits and scalp soft tissues are within normal limits. ASPECTS [facility] Stroke Program Early CT Score) - Ganglionic level infarction (caudate, lentiform nuclei, internal capsule, insula, M1-M3 cortex): 7 - Supraganglionic infarction (M4-M6 cortex): 3 Total score (0-10 with 10 being normal): 10 IMPRESSION: 1. Normal noncontrast CT appearance of the brain. 2. ASPECTS is 10 Electronically Signed: By: Odessa Fleming M.D. On: 05/01/2016 10:05    Assessment: 46 y.o. female  with hx of anxiety last known well about 5AM, woke up at 8 AM with L facial and L sided numbness that has improved. Pt now only complains of L facial numbness but it splits midline.  NIHSS is 1 Could be anxiety component as pt splits midline on sensory exam  Stroke Risk Factors - none  Plan: 1. HgbA1c, fasting lipid panel 2. MRI, MRA  of the brain without contrast 3. PT consult, OT consult, Speech consult 4. Echocardiogram 5. Carotid dopplers 6. Prophylactic therapy-Antiplatelet med: Aspirin - dose 81 daily 8. Telemetry monitoring 9. Frequent neuro checks   Likely d/c in AM  05/01/2016, 10:48 AM

## 2016-05-01 NOTE — ED Triage Notes (Signed)
Awoke with L arm pain and numbness and tingly, states does not "fell like talking", when questioned if it is diffucult to speak states yes. Speech clear, grips equal. Dizzy.

## 2016-05-01 NOTE — Plan of Care (Signed)
Problem: Physical Regulation: Goal: Ability to maintain clinical measurements within normal limits will improve Outcome: Progressing Pt admitted today from the ED. NIH 1. No neuro changes during the shift. Tylenol given once for headache with improvement.

## 2016-05-02 ENCOUNTER — Observation Stay: Payer: PRIVATE HEALTH INSURANCE

## 2016-05-02 ENCOUNTER — Encounter: Payer: Self-pay | Admitting: Internal Medicine

## 2016-05-02 DIAGNOSIS — G451 Carotid artery syndrome (hemispheric): Secondary | ICD-10-CM

## 2016-05-02 LAB — BASIC METABOLIC PANEL WITH GFR
Anion gap: 6 (ref 5–15)
BUN: 14 mg/dL (ref 6–20)
CO2: 28 mmol/L (ref 22–32)
Calcium: 9 mg/dL (ref 8.9–10.3)
Chloride: 103 mmol/L (ref 101–111)
Creatinine, Ser: 0.92 mg/dL (ref 0.44–1.00)
GFR calc Af Amer: >60 mL/min (ref >60)
GFR calc non Af Amer: >60 mL/min (ref >60)
Glucose, Bld: 103 mg/dL — ABNORMAL HIGH (ref 65–99)
Potassium: 3.7 mmol/L (ref 3.5–5.1)
Sodium: 137 mmol/L (ref 135–145)

## 2016-05-02 LAB — ECHOCARDIOGRAM COMPLETE
E decel time: 134 ms
E/e' ratio: 6.3
FS: 44 % (ref 28–44)
Height: 63 in
IVS/LV PW RATIO, ED: 1.24
LA ID, A-P, ES: 35 mm
LA diam end sys: 35 mm
LA diam index: 1.96 cm/m2
LA vol A4C: 26 mL
LA vol index: 17.5 mL/m2
LA vol: 31.3 mL
LV E/e' medial: 6.3
LV E/e'average: 6.3
LV PW d: 8.33 mm — AB (ref 0.6–1.1)
LV e' LATERAL: 11.7 cm/s
Lateral S' vel: 13.9 cm/s
MV Dec: 134
MV Peak grad: 2 mmHg
MV pk A vel: 62.6 m/s
MV pk E vel: 73.7 m/s
TAPSE: 23.4 mm
TDI e' lateral: 11.7
TDI e' medial: 7.95
Weight: 2480 [oz_av]

## 2016-05-02 LAB — CBC
HCT: 32.8 % — ABNORMAL LOW (ref 35.0–47.0)
Hemoglobin: 10.6 g/dL — ABNORMAL LOW (ref 12.0–16.0)
MCH: 23.3 pg — ABNORMAL LOW (ref 26.0–34.0)
MCHC: 32.2 g/dL (ref 32.0–36.0)
MCV: 72.2 fL — ABNORMAL LOW (ref 80.0–100.0)
Platelets: 267 K/uL (ref 150–440)
RBC: 4.53 MIL/uL (ref 3.80–5.20)
RDW: 17.5 % — ABNORMAL HIGH (ref 11.5–14.5)
WBC: 5.9 K/uL (ref 3.6–11.0)

## 2016-05-02 LAB — LIPID PANEL
Cholesterol: 199 mg/dL (ref 0–200)
HDL: 37 mg/dL — ABNORMAL LOW (ref >40)
LDL Cholesterol: 131 mg/dL — ABNORMAL HIGH (ref 0–99)
Total CHOL/HDL Ratio: 5.4 ratio
Triglycerides: 153 mg/dL — ABNORMAL HIGH (ref <150)
VLDL: 31 mg/dL (ref 0–40)

## 2016-05-02 LAB — MAGNESIUM: MAGNESIUM: 2.1 mg/dL (ref 1.7–2.4)

## 2016-05-02 MED ORDER — ATORVASTATIN CALCIUM 20 MG PO TABS: 20.0000 mg | ORAL_TABLET | Freq: Every day | ORAL | 0 refills | Status: DC

## 2016-05-02 MED ORDER — ASPIRIN 81 MG PO TABS: 81.0000 mg | ORAL_TABLET | Freq: Every day | ORAL | Status: DC

## 2016-05-02 NOTE — Discharge Summary (Signed)
[employer] - [place] at [facility]   PATIENT NAME: Mallory Jones    MR#:  [id number]  DATE OF BIRTH:  [birth year]  DATE OF ADMISSION:  05/01/2016 ADMITTING PHYSICIAN: Ramonita LabAruna Gouru, MD  DATE OF DISCHARGE: 05/02/2016  3:03 PM  PRIMARY CARE PHYSICIAN: Danella PentonMark F Miller, MD   ADMISSION DIAGNOSIS:  Left hand paresthesia [R20.2]  DISCHARGE DIAGNOSIS:  Active Problems:   TIA (transient ischemic attack)   SECONDARY DIAGNOSIS:   Past Medical History:  Diagnosis Date  . Anxiety   . History of depression   . History of hay fever   . History of hyperlipidemia   . History of sleep apnea   . History of UTI   . Hypertension      ADMITTING HISTORY  HISTORY OF PRESENT ILLNESS:  Mallory Jones  is a 46 y.o. female with a known history of Hypertension, sleep apnea, depression, hyperlipidemia is presenting to the ED with a chief complaint of left facial numbness. Last well-known at around 5 AM today but woke up at 8 AM with left facial and left-sided numbness that has improved. CT head is negative. Denies any headache or blurry vision. No similar complaints in the past. Denies any speech problem. Husband at bedside   HOSPITAL COURSE:   * TIA * Anxiety * Hyperlipidemia  Patient was admitted onto medical floor with telemetry monitoring. She had Q4 hour neuro checks. Symptoms resolved completely during the hospital stay. Initial CT scan of the head showed nothing acute. This was followed by MRI and MRA of the brain which showed no stroke or arterial clots. Patient was seen by neurology Dr. Loretha BrasilZeylikman who suggested possible anxiety being the cause. Could also be TIA. Patient was started on aspirin. LDL elevated and started on atorvastatin. Lifestyle changes advised.  Patient is being discharged home in a stable condition to follow-up with her primary care physician in one to 2 weeks.  CONSULTS OBTAINED:  Treatment Team:  Pauletta BrownsYuriy Zeylikman, MD  DRUG ALLERGIES:  No Known  Allergies  DISCHARGE MEDICATIONS:   Discharge Medication List as of 05/02/2016  2:33 PM    START taking these medications   Details  aspirin 81 MG tablet Take 1 tablet (81 mg total) by mouth daily., Starting Tue 05/02/2016, No Print    atorvastatin (LIPITOR) 20 MG tablet Take 1 tablet (20 mg total) by mouth daily at 6 PM., Starting Tue 05/02/2016, Print      CONTINUE these medications which have NOT CHANGED   Details  clomiPRAMINE (ANAFRANIL) 25 MG capsule Take 12.5 mg by mouth at bedtime., Historical Med    clonazePAM (KLONOPIN) 0.5 MG tablet Take 0.25 mg by mouth 3 (three) times daily as needed for anxiety. , Historical Med    fluvoxaMINE (LUVOX) 100 MG tablet Take 100 mg by mouth at bedtime. , Historical Med    lisinopril-hydrochlorothiazide (PRINZIDE,ZESTORETIC) 20-25 MG tablet Take 1 tablet by mouth daily., Starting Mon 05/01/2016, Until Tue 05/01/2017, Historical Med    busPIRone (BUSPAR) 15 MG tablet Take 15 mg by mouth 2 (two) times daily. , Starting Wed 06/24/2014, Historical Med        Today   VITAL SIGNS:  Blood pressure (!) 121/56, pulse 73, temperature 98 F (36.7 C), temperature source Oral, resp. rate 16, height 5\' 3"  (1.6 m), weight 70.3 kg (155 lb), last menstrual period 04/24/2016, SpO2 100 %.  I/O:   Intake/Output Summary (Last 24 hours) at 05/02/16 1538 Last data filed at 05/02/16 1352  Gross per 24  hour  Intake              600 ml  Output                0 ml  Net              600 ml    PHYSICAL EXAMINATION:  Physical Exam  GENERAL:  46 y.o.-year-old patient lying in the bed with no acute distress.  LUNGS: Normal breath sounds bilaterally, no wheezing, rales,rhonchi or crepitation. No use of accessory muscles of respiration.  CARDIOVASCULAR: S1, S2 normal. No murmurs, rubs, or gallops.  ABDOMEN: Soft, non-tender, non-distended. Bowel sounds present. No organomegaly or mass.  NEUROLOGIC: Moves all 4 extremities. PSYCHIATRIC: The patient is alert and  oriented x 3.  SKIN: No obvious rash, lesion, or ulcer.   DATA REVIEW:   CBC  Recent Labs Lab 05/02/16 0424  WBC 5.9  HGB 10.6*  HCT 32.8*  PLT 267    Chemistries   Recent Labs Lab 05/01/16 0959 05/02/16 0424  NA 137 137  K 3.2* 3.7  CL 101 103  CO2 26 28  GLUCOSE 104* 103*  BUN 11 14  CREATININE 0.77 0.92  CALCIUM 9.8 9.0  MG  --  2.1  AST 20  --   ALT 12*  --   ALKPHOS 79  --   BILITOT 0.4  --     Cardiac Enzymes  Recent Labs Lab 05/01/16 0959  TROPONINI <0.03    Microbiology Results  No results found for this or any previous visit.  RADIOLOGY:  Mr Brain Wo Contrast  Result Date: 05/02/2016 CLINICAL DATA:  Left facial numbness. EXAM: MRI HEAD WITHOUT CONTRAST MRA HEAD WITHOUT CONTRAST TECHNIQUE: Multiplanar, multiecho pulse sequences of the brain and surrounding structures were obtained without intravenous contrast. Angiographic images of the head were obtained using MRA technique without contrast. COMPARISON:  Head CT 05/01/2016 and MRI 12/07/2012 FINDINGS: MRI HEAD FINDINGS Brain: There is no evidence of acute infarct, intracranial hemorrhage, mass, midline shift, or extra-axial fluid collection. The ventricles and sulci are normal. The brain is normal in signal. Vascular: Major intracranial vascular flow voids are preserved. Skull and upper cervical spine: No focal marrow lesion. Sinuses/Orbits: Unremarkable orbits. Paranasal sinuses and mastoid air cells are clear. Other: None. MRA HEAD FINDINGS The visualized distal vertebral arteries are patent with the left being slightly larger than the right. PICAs, AICAs, and SCAs appear patent. The basilar artery is widely patent. Posterior communicating arteries are not identified. PCAs are patent without evidence of significant stenosis. The appearance of mild left P1 narrowing on reformatted images is favored to reflect mild motion artifact rather than a true stenosis on source images. The internal carotid arteries  are patent from skullbase to carotid termini without evidence of stenosis. ACAs and MCAs are patent without evidence of major branch occlusion or significant stenosis. No intracranial aneurysm is identified. IMPRESSION: Negative head MRI and MRA. Electronically Signed   By: Sebastian Ache M.D.   On: 05/02/2016 12:53   US Carotid Bilateral (at [facility] Only)  Result Date: 05/01/2016 CLINICAL DATA:  TIA symptoms, hypertension EXAM: BILATERAL CAROTID DUPLEX ULTRASOUND TECHNIQUE: Wallace Cullens scale imaging, color Doppler and duplex ultrasound were performed of bilateral carotid and vertebral arteries in the neck. COMPARISON:  None. FINDINGS: Criteria: Quantification of carotid stenosis is based on velocity parameters that correlate the residual internal carotid diameter with NASCET-based stenosis levels, using the diameter of the distal internal carotid lumen as the  denominator for stenosis measurement. The following velocity measurements were obtained: RIGHT ICA:  108/24 cm/sec CCA:  101/19 cm/sec SYSTOLIC ICA/CCA RATIO:  1.1 DIASTOLIC ICA/CCA RATIO:  1.3 ECA:  146 cm/sec LEFT ICA:  122/18 cm/sec CCA:  105/21 cm/sec SYSTOLIC ICA/CCA RATIO:  1.2 DIASTOLIC ICA/CCA RATIO:  0.9 ECA:  140 cm/sec RIGHT CAROTID ARTERY: Minor intimal thickening. No significant atherosclerotic plaque formation. No hemodynamically significant right ICA stenosis, velocity elevation, or turbulent flow. Degree of narrowing less than 50%. RIGHT VERTEBRAL ARTERY:  Antegrade LEFT CAROTID ARTERY: Similar minor intimal thickening. No significant atherosclerotic plaque formation. No hemodynamically significant left ICA stenosis, velocity elevation, or turbulent flow. LEFT VERTEBRAL ARTERY:  Antegrade IMPRESSION: Minor carotid intimal thickening without significant atherosclerosis. No hemodynamically significant ICA stenosis. Degree of narrowing less than 50% bilaterally. Patent antegrade vertebral flow bilaterally Electronically Signed   By: Judie Petit.  Shick M.D.    On: 05/01/2016 17:21   Mr Maxine Glenn Head/brain ZO Cm  Result Date: 05/02/2016 CLINICAL DATA:  Left facial numbness. EXAM: MRI HEAD WITHOUT CONTRAST MRA HEAD WITHOUT CONTRAST TECHNIQUE: Multiplanar, multiecho pulse sequences of the brain and surrounding structures were obtained without intravenous contrast. Angiographic images of the head were obtained using MRA technique without contrast. COMPARISON:  Head CT 05/01/2016 and MRI 12/07/2012 FINDINGS: MRI HEAD FINDINGS Brain: There is no evidence of acute infarct, intracranial hemorrhage, mass, midline shift, or extra-axial fluid collection. The ventricles and sulci are normal. The brain is normal in signal. Vascular: Major intracranial vascular flow voids are preserved. Skull and upper cervical spine: No focal marrow lesion. Sinuses/Orbits: Unremarkable orbits. Paranasal sinuses and mastoid air cells are clear. Other: None. MRA HEAD FINDINGS The visualized distal vertebral arteries are patent with the left being slightly larger than the right. PICAs, AICAs, and SCAs appear patent. The basilar artery is widely patent. Posterior communicating arteries are not identified. PCAs are patent without evidence of significant stenosis. The appearance of mild left P1 narrowing on reformatted images is favored to reflect mild motion artifact rather than a true stenosis on source images. The internal carotid arteries are patent from skullbase to carotid termini without evidence of stenosis. ACAs and MCAs are patent without evidence of major branch occlusion or significant stenosis. No intracranial aneurysm is identified. IMPRESSION: Negative head MRI and MRA. Electronically Signed   By: Sebastian Ache M.D.   On: 05/02/2016 12:53   Ct Head Code Stroke W/o Cm  Addendum Date: 05/01/2016   ADDENDUM REPORT: 05/01/2016 10:35 ADDENDUM: Study discussed by telephone with Dr. Sharyn Creamer on 05/01/2016 at 1017 hours. Electronically Signed   By: Odessa Fleming M.D.   On: 05/01/2016 10:35   Result  Date: 05/01/2016 CLINICAL DATA:  Code stroke. 46 year old female with left side numbness and tingling discovered at 0615 hours. Initial encounter. EXAM: CT HEAD WITHOUT CONTRAST TECHNIQUE: Contiguous axial images were obtained from the base of the skull through the vertex without intravenous contrast. COMPARISON:  Brain MRI 12/07/2012. FINDINGS: Brain: Cerebral volume remains normal. No midline shift, ventriculomegaly, mass effect, evidence of mass lesion, intracranial hemorrhage or evidence of cortically based acute infarction. Gray-white matter differentiation is within normal limits throughout the brain. Vascular: No suspicious intracranial vascular hyperdensity. Skull: No osseous abnormality identified. Sinuses/Orbits: Clear. Other: Visualized orbits and scalp soft tissues are within normal limits. ASPECTS [facility] Stroke Program Early CT Score) - Ganglionic level infarction (caudate, lentiform nuclei, internal capsule, insula, M1-M3 cortex): 7 - Supraganglionic infarction (M4-M6 cortex): 3 Total score (0-10 with 10 being normal): 10 IMPRESSION: 1.  Normal noncontrast CT appearance of the brain. 2. ASPECTS is 10 Electronically Signed: By: Odessa Fleming M.D. On: 05/01/2016 10:05    Follow up with PCP in 1 week.  Management plans discussed with the patient, family and they are in agreement.  CODE STATUS:     Code Status Orders        Start     Ordered   05/01/16 1326  Full code  Continuous     05/01/16 1325    Code Status History    Date Active Date Inactive Code Status Order ID Comments User Context   This patient has a current code status but no historical code status.      TOTAL TIME TAKING CARE OF THIS PATIENT ON DAY OF DISCHARGE: more than 30 minutes.   Milagros Loll R M.D on 05/02/2016 at 3:38 PM  Between 7am to 6pm - Pager - [phone]  After 6pm go to [email or web address] - password [email or web address]  [facility]  Office  [phone]  CC: Primary care physician; Danella Penton, MD  Note: This dictation was prepared with Dragon dictation along with smaller phrase technology. Any transcriptional errors that result from this process are unintentional.

## 2016-05-02 NOTE — Progress Notes (Signed)
Patient discharged per MD order. Prescription given to patient. All discharge instructions given and all questions answered. 

## 2016-05-02 NOTE — Consult Note (Signed)
Very minimal numbness L face awaiting MRI.    Past Medical History:  Diagnosis Date  . Anxiety   . History of depression   . History of hay fever   . History of hyperlipidemia   . History of sleep apnea   . History of UTI   . Hypertension     Past Surgical History:  Procedure Laterality Date  . CESAREAN SECTION  2005  . CESAREAN SECTION  2008  . COLONOSCOPY WITH PROPOFOL N/A 10/16/2014   Procedure: COLONOSCOPY WITH PROPOFOL;  Surgeon: Scot Jun, MD;  Location: [facility] ENDOSCOPY;  Service: Endoscopy;  Laterality: N/A;  . DILATION AND CURETTAGE OF UTERUS  2003    Family History  Problem Relation Age of Onset  . Alcohol abuse Father   . Colon cancer Father   . Hyperlipidemia Father   . Other Father     emotional illness  . Ovarian cancer Maternal Grandmother   . Kidney disease Maternal Grandmother   . Prostate cancer Maternal Grandfather    Social History:  reports that she has never smoked. She has never used smokeless tobacco. She reports that she does not drink alcohol or use drugs.  Allergies: No Known Allergies  Medications: I have reviewed the patient's current medications.  ROS: History obtained from the patient  General ROS: negative for - chills, fatigue, fever, night sweats, weight gain or weight loss Psychological ROS: anxiety  Ophthalmic ROS: negative for - blurry vision, double vision, eye pain or loss of vision ENT ROS: negative for - epistaxis, nasal discharge, oral lesions, sore throat, tinnitus or vertigo Allergy and Immunology ROS: negative for - hives or itchy/watery eyes Hematological and Lymphatic ROS: negative for - bleeding problems, bruising or swollen lymph nodes Endocrine ROS: negative for - galactorrhea, hair pattern changes, polydipsia/polyuria or temperature intolerance Respiratory ROS: negative for - cough, hemoptysis, shortness of breath or wheezing Cardiovascular ROS: negative for - chest pain, dyspnea on exertion, edema or irregular  heartbeat Gastrointestinal ROS: negative for - abdominal pain, diarrhea, hematemesis, nausea/vomiting or stool incontinence Genito-Urinary ROS: negative for - dysuria, hematuria, incontinence or urinary frequency/urgency Musculoskeletal ROS: negative for - joint swelling or muscular weakness Neurological ROS: as noted in HPI Dermatological ROS: negative for rash and skin lesion changes  Physical Examination: Blood pressure 132/87, pulse 70, temperature 97.6 F (36.4 C), temperature source Oral, resp. rate (!) 22, height 5\' 3"  (1.6 m), weight 70.3 kg (155 lb), last menstrual period 04/24/2016, SpO2 100 %.  HEENT-  Normocephalic, no lesions, without obvious abnormality.  Normal external eye and conjunctiva.  Normal TM's bilaterally.  Normal auditory canals and external ears. Normal external nose, mucus membranes and septum.  Normal pharynx. Cardiovascular- regular rate and rhythm, S1, S2 normal, no murmur, click, rub or gallop, pulses palpable throughout   Lungs- Heart exam - S1, S2 normal, no murmur, no gallop, rate regular Abdomen- soft, non-tender; bowel sounds normal; no masses,  no organomegaly Extremities- less then 2 second capillary refill Lymph-no adenopathy palpable Musculoskeletal-no joint tenderness, deformity or swelling Skin-warm and dry, no hyperpigmentation, vitiligo, or suspicious lesions  Neurological Examination   Mental Status: Alert, oriented, thought content appropriate.  Speech fluent without evidence of aphasia.  Able to follow 3 step commands without difficulty. Cranial Nerves: II: Discs flat bilaterally; Visual fields grossly normal, pupils equal, round, reactive to light and accommodation III,IV, VI: ptosis not present, extra-ocular motions intact bilaterally V,VII: smile symmetric, facial light touch sensation normal bilaterally VIII: hearing normal bilaterally IX,X: gag reflex  present XI: bilateral shoulder shrug XII: midline tongue extension Motor: Right  : Upper extremity   5/5    Left:     Upper extremity   5/5  Lower extremity   5/5     Lower extremity   5/5 Tone and bulk:normal tone throughout; no atrophy noted Sensory: decreased L side of face Deep Tendon Reflexes: 1+ and symmetric throughout Plantars: Right: downgoing   Left: downgoing Cerebellar: normal finger-to-nose, normal rapid alternating movements and normal heel-to-shin test Gait: not tested       Laboratory Studies:  Basic Metabolic Panel:  Recent Labs Lab 05/01/16 0959 05/02/16 0424  NA 137 137  K 3.2* 3.7  CL 101 103  CO2 26 28  GLUCOSE 104* 103*  BUN 11 14  CREATININE 0.77 0.92  CALCIUM 9.8 9.0  MG  --  2.1    Liver Function Tests:  Recent Labs Lab 05/01/16 0959  AST 20  ALT 12*  ALKPHOS 79  BILITOT 0.4  PROT 8.6*  ALBUMIN 4.6   No results for input(s): LIPASE, AMYLASE in the last 168 hours. No results for input(s): AMMONIA in the last 168 hours.  CBC:  Recent Labs Lab 05/01/16 0959 05/02/16 0424  WBC 10.2 5.9  NEUTROABS 8.9*  --   HGB 11.5* 10.6*  HCT 34.7* 32.8*  MCV 70.9* 72.2*  PLT 271 267    Cardiac Enzymes:  Recent Labs Lab 05/01/16 0959  TROPONINI <0.03    BNP: Invalid input(s): POCBNP  CBG:  Recent Labs Lab 05/01/16 1000  GLUCAP 112*    Microbiology: No results found for this or any previous visit.  Coagulation Studies:  Recent Labs  05/01/16 0959  LABPROT 12.3  INR 0.92    Urinalysis:   Recent Labs Lab 05/01/16 0959  COLORURINE COLORLESS*  LABSPEC 1.002*  PHURINE 8.0  GLUCOSEU NEGATIVE  HGBUR SMALL*  BILIRUBINUR NEGATIVE  KETONESUR NEGATIVE  PROTEINUR NEGATIVE  NITRITE NEGATIVE  LEUKOCYTESUR NEGATIVE    Lipid Panel:    Component Value Date/Time   CHOL 199 05/02/2016 0424   TRIG 153 (H) 05/02/2016 0424   HDL 37 (L) 05/02/2016 0424   CHOLHDL 5.4 05/02/2016 0424   VLDL 31 05/02/2016 0424   LDLCALC 131 (H) 05/02/2016 0424    HgbA1C: No results found for: HGBA1C  Urine Drug  Screen:  No results found for: LABOPIA, COCAINSCRNUR, LABBENZ, AMPHETMU, THCU, LABBARB  Alcohol Level:   Recent Labs Lab 05/01/16 0959  ETH <5    Other results: EKG: normal EKG, normal sinus rhythm, unchanged from previous tracings.  Imaging: US Carotid Bilateral (at [facility] Only)  Result Date: 05/01/2016 CLINICAL DATA:  TIA symptoms, hypertension EXAM: BILATERAL CAROTID DUPLEX ULTRASOUND TECHNIQUE: Wallace Cullens scale imaging, color Doppler and duplex ultrasound were performed of bilateral carotid and vertebral arteries in the neck. COMPARISON:  None. FINDINGS: Criteria: Quantification of carotid stenosis is based on velocity parameters that correlate the residual internal carotid diameter with NASCET-based stenosis levels, using the diameter of the distal internal carotid lumen as the denominator for stenosis measurement. The following velocity measurements were obtained: RIGHT ICA:  108/24 cm/sec CCA:  101/19 cm/sec SYSTOLIC ICA/CCA RATIO:  1.1 DIASTOLIC ICA/CCA RATIO:  1.3 ECA:  146 cm/sec LEFT ICA:  122/18 cm/sec CCA:  105/21 cm/sec SYSTOLIC ICA/CCA RATIO:  1.2 DIASTOLIC ICA/CCA RATIO:  0.9 ECA:  140 cm/sec RIGHT CAROTID ARTERY: Minor intimal thickening. No significant atherosclerotic plaque formation. No hemodynamically significant right ICA stenosis, velocity elevation, or turbulent flow. Degree of  narrowing less than 50%. RIGHT VERTEBRAL ARTERY:  Antegrade LEFT CAROTID ARTERY: Similar minor intimal thickening. No significant atherosclerotic plaque formation. No hemodynamically significant left ICA stenosis, velocity elevation, or turbulent flow. LEFT VERTEBRAL ARTERY:  Antegrade IMPRESSION: Minor carotid intimal thickening without significant atherosclerosis. No hemodynamically significant ICA stenosis. Degree of narrowing less than 50% bilaterally. Patent antegrade vertebral flow bilaterally Electronically Signed   By: Judie PetitM.  Shick M.D.   On: 05/01/2016 17:21   Ct Head Code Stroke W/o  Cm  Addendum Date: 05/01/2016   ADDENDUM REPORT: 05/01/2016 10:35 ADDENDUM: Study discussed by telephone with Dr. Sharyn CreamerMARK QUALE on 05/01/2016 at 1017 hours. Electronically Signed   By: Odessa FlemingH  Hall M.D.   On: 05/01/2016 10:35   Result Date: 05/01/2016 CLINICAL DATA:  Code stroke. 46 year old female with left side numbness and tingling discovered at 0615 hours. Initial encounter. EXAM: CT HEAD WITHOUT CONTRAST TECHNIQUE: Contiguous axial images were obtained from the base of the skull through the vertex without intravenous contrast. COMPARISON:  Brain MRI 12/07/2012. FINDINGS: Brain: Cerebral volume remains normal. No midline shift, ventriculomegaly, mass effect, evidence of mass lesion, intracranial hemorrhage or evidence of cortically based acute infarction. Gray-white matter differentiation is within normal limits throughout the brain. Vascular: No suspicious intracranial vascular hyperdensity. Skull: No osseous abnormality identified. Sinuses/Orbits: Clear. Other: Visualized orbits and scalp soft tissues are within normal limits. ASPECTS [facility] Stroke Program Early CT Score) - Ganglionic level infarction (caudate, lentiform nuclei, internal capsule, insula, M1-M3 cortex): 7 - Supraganglionic infarction (M4-M6 cortex): 3 Total score (0-10 with 10 being normal): 10 IMPRESSION: 1. Normal noncontrast CT appearance of the brain. 2. ASPECTS is 10 Electronically Signed: By: Odessa FlemingH  Hall M.D. On: 05/01/2016 10:05    Assessment: 46 y.o. female with hx of anxiety last known well about 5AM, woke up at 8 AM with L facial and L sided numbness that has improved. Pt now only complains of L facial numbness but it splits midline.  NIHSS is 1 Could be anxiety component as pt splits midline on sensory exam  - Awaiting MRI - Cont ASA and statin - After MRI done likely d/c today - d/w pt and family 05/02/2016, 10:28 AM

## 2016-05-02 NOTE — Evaluation (Signed)
Occupational Therapy Evaluation Patient Details Name: Mallory Jones MRN: [id number] DOB: 12/08/1970 Today's Date: 05/02/2016    History of Present Illness 46 y.o. female with a known history of Hypertension, sleep apnea, depression, hyperlipidemia is presenting to the ED with a chief complaint of left facial numbness. Last well-known at around 5 AM on 2/12 but woke up at 8 AM with left facial and left-sided numbness that has improved. CT head is negative. Denies any headache or blurry vision. No similar complaints in the past.   Clinical Impression   Pt seen for OT evaluation this date. Pt presents with good safety awareness and participation in OT session. Per pt report, LUE and L side of face tingling/numbness has resolved but pt does report that L side of face "still feels different", however, no functional difficulties with eating breakfast or speaking. Pt was independent with all ADL, driving, working, and caregiving for 2 children prior to admission and eager to get back home. Pt presents at baseline level with functional mobility and self care tasks with no reported pain/dizziness/vision changes. Pt does not have any additional OT needs at this time. OT will sign off. Please re-consult if there is a change in pt status.    Follow Up Recommendations  No OT follow up    Equipment Recommendations  None recommended by OT    Recommendations for Other Services       Precautions / Restrictions Precautions Precautions: None Restrictions Weight Bearing Restrictions: No      Mobility Bed Mobility Overal bed mobility: Independent             General bed mobility comments: Pt performed all bed mobility safely and with no assist needed  Transfers Overall transfer level: Independent Equipment used: None             General transfer comment: pt performed all transfers independently with safe technique and no LOB    Balance Overall balance assessment: Independent                                          ADL Overall ADL's : At baseline;Independent                                       General ADL Comments: Pt able to perform functional mobility including toilet transfers, and UB/LB dressing tasks independently     Vision Vision Assessment?: No apparent visual deficits;Yes Eye Alignment: Within Functional Limits Ocular Range of Motion: Within Functional Limits Alignment/Gaze Preference: Within Defined Limits Tracking/Visual Pursuits: Able to track stimulus in all quads without difficulty Saccades: Within functional limits Convergence: Within functional limits Visual Fields: No apparent deficits   Perception     Praxis Praxis Praxis tested?: Within functional limits    Pertinent Vitals/Pain Pain Assessment: No/denies pain     Hand Dominance     Extremity/Trunk Assessment Upper Extremity Assessment Upper Extremity Assessment: Overall WFL for tasks assessed (normal even strength, ROM bilaterally)   Lower Extremity Assessment Lower Extremity Assessment: Overall WFL for tasks assessed (normal strength, ROM)   Cervical / Trunk Assessment Cervical / Trunk Assessment: Normal   Communication Communication Communication: No difficulties   Cognition Arousal/Alertness: Awake/alert Behavior During Therapy: WFL for tasks assessed/performed Overall Cognitive Status: Within Functional Limits for tasks assessed  General Comments       Exercises       Shoulder Instructions      Home Living Family/patient expects to be discharged to:: Private residence Living Arrangements: Spouse/significant other;Children (2 kids, ages 24 and 59) Available Help at Discharge: Family;Available 24 hours/day Type of Home: House Home Access: Stairs to enter [employer] of Steps: 1 STE from back door Entrance Stairs-Rails: None Home Layout: Two level;Able to live on main level with  bedroom/bathroom Alternate Level Stairs-Number of Steps: 12-13 steps to 2nd floor   Bathroom Shower/Tub: Walk-in shower;Door;Curtain   Bathroom Toilet: Standard Bathroom Accessibility: No   Home Equipment: Shower seat - built in          Prior Functioning/Environment Level of Independence: Independent        Comments: Independent with all ADL, IADL, caregiving for children, driving, and working as a Midwife        OT Problem List:     OT Treatment/Interventions:      OT Goals(Current goals can be found in the care plan section) Acute Rehab OT Goals Patient Stated Goal: go home OT Goal Formulation: With patient/family Time For Goal Achievement: 05/02/16 Potential to Achieve Goals: Good  OT Frequency:     Barriers to D/C:            Co-evaluation              End of Session    Activity Tolerance: Patient tolerated treatment well Patient left: in bed;with call bell/phone within reach;with family/visitor present   Time: 1610-9604 OT Time Calculation (min): 12 min Charges:  OT General Charges $OT Visit: 1 Procedure OT Evaluation $OT Eval Low Complexity: 1 Procedure G-Codes: OT G-codes **NOT FOR INPATIENT CLASS** Functional Limitation: Self care Self Care Current Status (V4098): 0 percent impaired, limited or restricted Self Care Goal Status (J1914): 0 percent impaired, limited or restricted Self Care Discharge Status (N8295): 0 percent impaired, limited or restricted  Eliezer Bottom, OTR/L 05/02/2016, 10:14 AM

## 2016-05-02 NOTE — Discharge Instructions (Signed)
Heart healthy diet. °Activity as tolerated. °

## 2016-05-02 NOTE — Progress Notes (Signed)
SLP Cancellation Note  Patient Details Name: Mallory Jones MRN: [id number] DOB: 06/10/1970   Cancelled treatment:       Reason Eval/Treat Not Completed: SLP screened, no needs identified, will sign off (chart reviewed; consulted NSG then pt/husband). Met w/ pt and husband who denied any difficulty w/ swallowing meals/pills w/ NSG. Pt denied any difficulty w/ her speech and language. She conversed w/ SLP at conversational level w/ no apparent speech-language deficits; husband agreed.  ST services wil sign off at this time. NSG to reconsult if needed.    Orinda Kenner, MS, CCC-SLP Mallory Jones 05/02/2016, 10:41 AM

## 2016-05-02 NOTE — Progress Notes (Signed)
PT Cancellation Note  Patient Details Name: Mallory ShireLisa P Jones MRN: [id number] DOB: 01-28-1971   Cancelled Treatment:    Reason Eval/Treat Not Completed: Other (comment) (Per discussion with OT, primary RN, patient now returned to baseline without residual weakness noted. Imaging negative for acute change/injury; scheduled for discharge this PM.  No acute PT needs identified at this time.  Will complete initial order; please re-consult as medically appropriate.)   Christion Leonhard H. Manson PasseyBrown, PT, DPT, NCS 05/02/16, 2:27 PM [phone]

## 2016-05-03 LAB — HEMOGLOBIN A1C
HEMOGLOBIN A1C: 5.2 % (ref 4.8–5.6)
MEAN PLASMA GLUCOSE: 103 mg/dL

## 2016-08-31 ENCOUNTER — Encounter: Payer: Self-pay | Admitting: Obstetrics and Gynecology

## 2016-08-31 ENCOUNTER — Other Ambulatory Visit: Payer: Self-pay | Admitting: Obstetrics and Gynecology

## 2016-08-31 ENCOUNTER — Ambulatory Visit (INDEPENDENT_AMBULATORY_CARE_PROVIDER_SITE_OTHER): Payer: PRIVATE HEALTH INSURANCE | Admitting: Obstetrics and Gynecology

## 2016-08-31 VITALS — BP 118/92 | HR 85 | Ht 63.0 in | Wt 156.6 lb

## 2016-08-31 DIAGNOSIS — N920 Excessive and frequent menstruation with regular cycle: Secondary | ICD-10-CM

## 2016-08-31 DIAGNOSIS — E663 Overweight: Secondary | ICD-10-CM

## 2016-08-31 DIAGNOSIS — Z01419 Encounter for gynecological examination (general) (routine) without abnormal findings: Secondary | ICD-10-CM | POA: Diagnosis not present

## 2016-08-31 NOTE — Patient Instructions (Addendum)
Preventive Care 18-39 Years, Female Preventive care refers to lifestyle choices and visits with your health care provider that can promote health and wellness. What does preventive care include?  A yearly physical exam. This is also called an annual well check.  Dental exams once or twice a year.  Routine eye exams. Ask your health care provider how often you should have your eyes checked.  Personal lifestyle choices, including: ? Daily care of your teeth and gums. ? Regular physical activity. ? Eating a healthy diet. ? Avoiding tobacco and drug use. ? Limiting alcohol use. ? Practicing safe sex. ? Taking vitamin and mineral supplements as recommended by your health care provider. What happens during an annual well check? The services and screenings done by your health care provider during your annual well check will depend on your age, overall health, lifestyle risk factors, and family history of disease. Counseling Your health care provider may ask you questions about your:  Alcohol use.  Tobacco use.  Drug use.  Emotional well-being.  Home and relationship well-being.  Sexual activity.  Eating habits.  Work and work Statistician.  Method of birth control.  Menstrual cycle.  Pregnancy history.  Screening You may have the following tests or measurements:  Height, weight, and BMI.  Diabetes screening. This is done by checking your blood sugar (glucose) after you have not eaten for a while (fasting).  Blood pressure.  Lipid and cholesterol levels. These may be checked every 5 years starting at age 38.  Skin check.  Hepatitis C blood test.  Hepatitis B blood test.  Sexually transmitted disease (STD) testing.  BRCA-related cancer screening. This may be done if you have a family history of breast, ovarian, tubal, or peritoneal cancers.  Pelvic exam and Pap test. This may be done every 3 years starting at age 38. Starting at age 30, this may be done  every 5 years if you have a Pap test in combination with an HPV test.  Discuss your test results, treatment options, and if necessary, the need for more tests with your health care provider. Vaccines Your health care provider may recommend certain vaccines, such as:  Influenza vaccine. This is recommended every year.  Tetanus, diphtheria, and acellular pertussis (Tdap, Td) vaccine. You may need a Td booster every 10 years.  Varicella vaccine. You may need this if you have not been vaccinated.  HPV vaccine. If you are 39 or younger, you may need three doses over 6 months.  Measles, mumps, and rubella (MMR) vaccine. You may need at least one dose of MMR. You may also need a second dose.  Pneumococcal 13-valent conjugate (PCV13) vaccine. You may need this if you have certain conditions and were not previously vaccinated.  Pneumococcal polysaccharide (PPSV23) vaccine. You may need one or two doses if you smoke cigarettes or if you have certain conditions.  Meningococcal vaccine. One dose is recommended if you are age 68-21 years and a first-year college student living in a residence hall, or if you have one of several medical conditions. You may also need additional booster doses.  Hepatitis A vaccine. You may need this if you have certain conditions or if you travel or work in places where you may be exposed to hepatitis A.  Hepatitis B vaccine. You may need this if you have certain conditions or if you travel or work in places where you may be exposed to hepatitis B.  Haemophilus influenzae type b (Hib) vaccine. You may need this  if you have certain risk factors.  Talk to your health care provider about which screenings and vaccines you need and how often you need them. This information is not intended to replace advice given to you by your health care provider. Make sure you discuss any questions you have with your health care provider. Document Released: 05/02/2001 Document Revised:  11/24/2015 Document Reviewed: 01/05/2015 Elsevier Interactive Patient Education  2017 Else[employer] Inc.  Metrorrhagia Metrorrhagia is bleeding from the uterus that happens irregularly but often. The bleeding generally happens between menstrual periods. Follow these instructions at home: Pay attention to any changes in your symptoms. Follow these instructions to help with your condition: Eating and drinking  Eat well-balanced meals. Include foods that are high in iron, such as liver, meat, shellfish, green leafy vegetables, and eggs.  If you become constipated: ? Drink plenty of water. ? Eat fruits and vegetables that are high in water and fiber, such as spinach, carrots, raspberries, apples, and mango. Medicines  Take over-the-counter and prescription medicines only as told by your health care provider.  Do not change medicines without talking with your health care provider.  Aspirin or medicines that contain aspirin may make the bleeding worse. Do not take those medicines: ? During the week before your period. ? During your period.  If you were prescribed iron pills, take them as told by your health care provider. Iron pills help to replace iron that your body loses because of this condition. Activity  If you need to change your sanitary pad or tampon more than one time every 2 hours: ? Lie in bed with your feet raised (elevated). ? Place a cold pack on your lower abdomen. ? Rest as much as possible until the bleeding stops or slows down.  Do not try to lose weight until the bleeding has stopped and your blood iron level is back to normal. Other Instructions  For two months, write down: ? When your period starts. ? When your period ends. ? When any abnormal bleeding occurs. ? What problems you notice.  Keep all follow-up visits as told by your health care provider. This is important. Contact a health care provider if:  You get light-headed or weak.  You have nausea and  vomiting.  You cannot eat or drink without vomiting.  You feel dizzy or have diarrhea while you are taking medicine.  You are taking birth control pills or hormones, and you want to change them or stop taking them. Get help right away if:  You develop a fever or chills.  You need to change your sanitary pad or tampon more than one time per hour.  Your bleeding becomesheavy.  Your flow contains clots.  You develop pain in your abdomen.  You lose consciousness.  You develop a rash. This information is not intended to replace advice given to you by your health care provider. Make sure you discuss any questions you have with your health care provider. Document Released: 03/06/2005 Document Revised: 08/09/2015 Document Reviewed: 06/01/2014 Elsevier Interactive Patient Education  2018 [employer]. Levonorgestrel intrauterine device (IUD) What is this medicine? LEVONORGESTREL IUD (LEE voe nor jes trel) is a contraceptive (birth control) device. The device is placed inside the uterus by a healthcare professional. It is used to prevent pregnancy. This device can also be used to treat heavy bleeding that occurs during your period. This medicine may be used for other purposes; ask your health care provider or pharmacist if you have questions. COMMON BRAND NAME(S):  Minette Headland What should I tell my health care provider before I take this medicine? They need to know if you have any of these conditions: -abnormal Pap smear -cancer of the breast, uterus, or cervix -diabetes -endometritis -genital or pelvic infection now or in the past -have more than one sexual partner or your partner has more than one partner -heart disease -history of an ectopic or tubal pregnancy -immune system problems -IUD in place -liver disease or tumor -problems with blood clots or take blood-thinners -seizures -use intravenous drugs -uterus of unusual shape -vaginal bleeding that has  not been explained -an unusual or allergic reaction to levonorgestrel, other hormones, silicone, or polyethylene, medicines, foods, dyes, or preservatives -pregnant or trying to get pregnant -breast-feeding How should I use this medicine? This device is placed inside the uterus by a health care professional. Talk to your pediatrician regarding the use of this medicine in children. Special care may be needed. Overdosage: If you think you have taken too much of this medicine contact a poison control center or emergency room at once. NOTE: This medicine is only for you. Do not share this medicine with others. What if I miss a dose? This does not apply. Depending on the brand of device you have inserted, the device will need to be replaced every 3 to 5 years if you wish to continue using this type of birth control. What may interact with this medicine? Do not take this medicine with any of the following medications: -amprenavir -bosentan -fosamprenavir This medicine may also interact with the following medications: -aprepitant -armodafinil -barbiturate medicines for inducing sleep or treating seizures -bexarotene -boceprevir -griseofulvin -medicines to treat seizures like carbamazepine, ethotoin, felbamate, oxcarbazepine, phenytoin, topiramate -modafinil -pioglitazone -rifabutin -rifampin -rifapentine -some medicines to treat HIV infection like atazanavir, efavirenz, indinavir, lopinavir, nelfinavir, tipranavir, ritonavir -St. John's wort -warfarin This list may not describe all possible interactions. Give your health care provider a list of all the medicines, herbs, non-prescription drugs, or dietary supplements you use. Also tell them if you smoke, drink alcohol, or use illegal drugs. Some items may interact with your medicine. What should I watch for while using this medicine? Visit your doctor or health care professional for regular check ups. See your doctor if you or your partner  has sexual contact with others, becomes HIV positive, or gets a sexual transmitted disease. This product does not protect you against HIV infection (AIDS) or other sexually transmitted diseases. You can check the placement of the IUD yourself by reaching up to the top of your vagina with clean fingers to feel the threads. Do not pull on the threads. It is a good habit to check placement after each menstrual period. Call your doctor right away if you feel more of the IUD than just the threads or if you cannot feel the threads at all. The IUD may come out by itself. You may become pregnant if the device comes out. If you notice that the IUD has come out use a backup birth control method like condoms and call your health care provider. Using tampons will not change the position of the IUD and are okay to use during your period. This IUD can be safely scanned with magnetic resonance imaging (MRI) only under specific conditions. Before you have an MRI, tell your healthcare provider that you have an IUD in place, and which type of IUD you have in place. What side effects may I notice from receiving this medicine? Side effects that  you should report to your doctor or health care professional as soon as possible: -allergic reactions like skin rash, itching or hives, swelling of the face, lips, or tongue -fever, flu-like symptoms -genital sores -high blood pressure -no menstrual period for 6 weeks during use -pain, swelling, warmth in the leg -pelvic pain or tenderness -severe or sudden headache -signs of pregnancy -stomach cramping -sudden shortness of breath -trouble with balance, talking, or walking -unusual vaginal bleeding, discharge -yellowing of the eyes or skin Side effects that usually do not require medical attention (report to your doctor or health care professional if they continue or are bothersome): -acne -breast pain -change in sex drive or performance -changes in weight -cramping,  dizziness, or faintness while the device is being inserted -headache -irregular menstrual bleeding within first 3 to 6 months of use -nausea This list may not describe all possible side effects. Call your doctor for medical advice about side effects. You may report side effects to FDA at 1-800-FDA-1088. Where should I keep my medicine? This does not apply. NOTE: This sheet is a summary. It may not cover all possible information. If you have questions about this medicine, talk to your doctor, pharmacist, or health care provider.  2018 Elsevier/Gold Standard (2015-12-17 14:14:56)  Endometrial Ablation Endometrial ablation is a procedure that destroys the thin inner layer of the lining of the uterus (endometrium). This procedure may be done:  To stop heavy periods.  To stop bleeding that is causing anemia.  To control irregular bleeding.  To treat bleeding caused by small tumors (fibroids) in the endometrium.  This procedure is often an alternative to major surgery, such as removal of the uterus and cervix (hysterectomy). As a result of this procedure:  You may not be able to have children. However, if you are premenopausal (you have not gone through menopause): ? You may still have a small chance of getting pregnant. ? You will need to use a reliable method of birth control after the procedure to prevent pregnancy.  You may stop having a menstrual period, or you may have only a small amount of bleeding during your period. Menstruation may return several years after the procedure.  Tell a health care provider about:  Any allergies you have.  All medicines you are taking, including vitamins, herbs, eye drops, creams, and over-the-counter medicines.  Any problems you or family members have had with the use of anesthetic medicines.  Any blood disorders you have.  Any surgeries you have had.  Any medical conditions you have. What are the risks? Generally, this is a safe  procedure. However, problems may occur, including:  A hole (perforation) in the uterus or bowel.  Infection of the uterus, bladder, or vagina.  Bleeding.  Damage to other structures or organs.  An air bubble in the lung (air embolus).  Problems with pregnancy after the procedure.  Failure of the procedure.  Decreased ability to diagnose cancer in the endometrium.  What happens before the procedure?  You will have tests of your endometrium to make sure there are no pre-cancerous cells or cancer cells present.  You may have an ultrasound of the uterus.  You may be given medicines to thin the endometrium.  Ask your health care provider about: ? Changing or stopping your regular medicines. This is especially important if you take diabetes medicines or blood thinners. ? Taking medicines such as aspirin and ibuprofen. These medicines can thin your blood. Do not take these medicines before your procedure if  your doctor tells you not to.  Plan to have someone take you home from the hospital or clinic. What happens during the procedure?  You will lie on an exam table with your feet and legs supported as in a pelvic exam.  To lower your risk of infection: ? Your health care team will wash or sanitize their hands and put on germ-free (sterile) gloves. ? Your genital area will be washed with soap.  An IV tube will be inserted into one of your veins.  You will be given a medicine to help you relax (sedative).  A surgical instrument with a light and camera (resectoscope) will be inserted into your vagina and moved into your uterus. This allows your surgeon to see inside your uterus.  Endometrial tissue will be removed using one of the following methods: ? Radiofrequency. This method uses a radiofrequency-alternating electric current to remove the endometrium. ? Cryotherapy. This method uses extreme cold to freeze the endometrium. ? Heated-free liquid. This method uses a heated  saltwater (saline) solution to remove the endometrium. ? Microwave. This method uses high-energy microwaves to heat up the endometrium and remove it. ? Thermal balloon. This method involves inserting a catheter with a balloon tip into the uterus. The balloon tip is filled with heated fluid to remove the endometrium. The procedure may vary among health care providers and hospitals. What happens after the procedure?  Your blood pressure, heart rate, breathing rate, and blood oxygen level will be monitored until the medicines you were given have worn off.  As tissue healing occurs, you may notice vaginal bleeding for 4-6 weeks after the procedure. You may also experience: ? Cramps. ? Thin, watery vaginal discharge that is light pink or brown in color. ? A need to urinate more frequently than usual. ? Nausea.  Do not drive for 24 hours if you were given a sedative.  Do not have sex or insert anything into your vagina until your health care provider approves. Summary  Endometrial ablation is done to treat the many causes of heavy menstrual bleeding.  The procedure may be done only after medications have been tried to control the bleeding.  Plan to have someone take you home from the hospital or clinic. This information is not intended to replace advice given to you by your health care provider. Make sure you discuss any questions you have with your health care provider. Document Released: 01/14/2004 Document Revised: 03/23/2016 Document Reviewed: 03/23/2016 Elsevier Interactive Patient Education  2017 [employer].

## 2016-08-31 NOTE — Progress Notes (Signed)
Subjective:   Mallory Jones is a 46 y.o. G2P2 Caucasian female here for a routine well-woman exam.  Patient's last menstrual period was 08/21/2016.    Current complaints: heavy monthly menses causing anemia, large clots for 4-6 days,  PCP: Miller,Mark       doesn't desire labs  Social History: Sexual: heterosexual Marital Status: married Living situation: with family Occupation: Runner, broadcasting/film/videoTeacher at [employer] [place] Tobacco/alcohol: no tobacco use Illicit drugs: no history of illicit drug use  The following portions of the patient's history were reviewed and updated as appropriate: allergies, current medications, past family history, past medical history, past social history, past surgical history and problem list.  Past Medical History Past Medical History:  Diagnosis Date  . Anxiety   . History of depression   . History of hay fever   . History of hyperlipidemia   . History of sleep apnea   . History of UTI   . Hypertension     Past Surgical History Past Surgical History:  Procedure Laterality Date  . CESAREAN SECTION  2005  . CESAREAN SECTION  2008  . COLONOSCOPY WITH PROPOFOL N/A 10/16/2014   Procedure: COLONOSCOPY WITH PROPOFOL;  Surgeon: Scot Junobert T Elliott, MD;  Location: [facility] ENDOSCOPY;  Service: Endoscopy;  Laterality: N/A;  . DILATION AND CURETTAGE OF UTERUS  2003    Gynecologic History G2P2  Patient's last menstrual period was 08/21/2016. Contraception: none Last Pap: ?Marland Kitchen. Results were: normal Last mammogram: 2015. Results were: normal   Obstetric History OB History  Gravida Para Term Preterm AB Living  2 2          SAB TAB Ectopic Multiple Live Births               # Outcome Date GA Lbr Len/2nd Weight Sex Delivery Anes PTL Lv  2 Para 2008    F CS-Unspec     1 Para 2005    M CS-Unspec         Current Medications Current Outpatient Prescriptions on File Prior to Visit  Medication Sig Dispense Refill  . clomiPRAMINE (ANAFRANIL) 25 MG capsule Take 12.5 mg by  mouth at bedtime.    . clonazePAM (KLONOPIN) 0.5 MG tablet Take 0.25 mg by mouth 3 (three) times daily as needed for anxiety.     . fluvoxaMINE (LUVOX) 100 MG tablet Take 100 mg by mouth at bedtime.     Marland Kitchen. lisinopril-hydrochlorothiazide (PRINZIDE,ZESTORETIC) 20-25 MG tablet Take 1 tablet by mouth daily.    Marland Kitchen. aspirin 81 MG tablet Take 1 tablet (81 mg total) by mouth daily. (Patient not taking: Reported on 08/31/2016) 30 tablet   . atorvastatin (LIPITOR) 20 MG tablet Take 1 tablet (20 mg total) by mouth daily at 6 PM. (Patient not taking: Reported on 08/31/2016) 30 tablet 0   No current facility-administered medications on file prior to visit.     Review of Systems Patient denies any headaches, blurred vision, shortness of breath, chest pain, abdominal pain, problems with bowel movements, urination, or intercourse.  Objective:  BP (!) 118/92   Pulse 85   Ht 5\' 3"  (1.6 m)   Wt 156 lb 9.6 oz (71 kg)   LMP 08/21/2016   BMI 27.74 kg/m  Physical Exam  General:  Well developed, well nourished, no acute distress. She is alert and oriented x3. Skin:  Warm and dry Neck:  Midline trachea, no thyromegaly or nodules Cardiovascular: Regular rate and rhythm, no murmur heard Lungs:  Effort normal, all lung fields  clear to auscultation bilaterally Breasts:  No dominant palpable mass, retraction, or nipple discharge Abdomen:  Soft, non tender, no hepatosplenomegaly or masses Pelvic:  External genitalia is normal in appearance.  The vagina is normal in appearance. The cervix is bulbous, no CMT.  Thin prep pap is done with HR HPV cotesting. Uterus is felt to be normal size, shape, and contour.  No adnexal masses or tenderness noted. Extremities:  No swelling or varicosities noted Psych:  She has a normal mood and affect  Assessment:   Healthy well-woman exam Overweight Menorrhagia with regular menses. Contraception counseling  Plan:  Discussed treatment options for heavy menses including Novasure &  Mirena IUD. Gave info to review. Will return for pelvic ultrasound and to discuss more. Discussed weight loss program offered here, will consider and let me know at next appointment. F/U 1 year for AE, or sooner if needed Mammogram ordered.  Claudis Giovanelli Suzan Nailer, CNM

## 2016-09-06 LAB — CYTOLOGY - PAP

## 2016-09-08 ENCOUNTER — Ambulatory Visit (INDEPENDENT_AMBULATORY_CARE_PROVIDER_SITE_OTHER): Payer: PRIVATE HEALTH INSURANCE | Admitting: Obstetrics and Gynecology

## 2016-09-08 ENCOUNTER — Ambulatory Visit (INDEPENDENT_AMBULATORY_CARE_PROVIDER_SITE_OTHER): Payer: PRIVATE HEALTH INSURANCE

## 2016-09-08 ENCOUNTER — Encounter: Payer: Self-pay | Admitting: Obstetrics and Gynecology

## 2016-09-08 VITALS — BP 136/90 | HR 80 | Ht 63.0 in | Wt 155.7 lb

## 2016-09-08 DIAGNOSIS — E663 Overweight: Secondary | ICD-10-CM | POA: Diagnosis not present

## 2016-09-08 DIAGNOSIS — N920 Excessive and frequent menstruation with regular cycle: Secondary | ICD-10-CM

## 2016-09-08 MED ORDER — CYANOCOBALAMIN 1000 MCG/ML IJ SOLN: 1000.0000 ug | INTRAMUSCULAR | 1 refills | Status: DC

## 2016-09-08 MED ORDER — PHENTERMINE HCL 37.5 MG PO TABS: 37.5000 mg | ORAL_TABLET | Freq: Every day | ORAL | 2 refills | Status: DC

## 2016-09-08 MED ORDER — TRANEXAMIC ACID 650 MG PO TABS: 1300.0000 mg | ORAL_TABLET | Freq: Three times a day (TID) | ORAL | 2 refills | Status: DC

## 2016-09-08 NOTE — Progress Notes (Signed)
Subjective:     Patient ID: Mallory Jones, female   DOB: 30-Sep-1970, 46 y.o.   MRN: [id number]  HPI Here for follow up ultrasound for heavy menstrual bleeding.  Review of Systems Negative since last visit    Objective:   Physical Exam A&O x4 Well groomed female in no distress Blood pressure 136/90, pulse 80, height 5\' 3"  (1.6 m), weight 155 lb 11.2 oz (70.6 kg), last menstrual period 08/21/2016. Body mass index is 27.58 kg/m.     Ultrasound Findings:  The uterus is anteverted and measures 10.0 x 4.7 x 5.4 cm. Echo texture is overall homogenous without evidence of focal masses. Cervix: There are multiple nabothian cysts with the largest near the c-section scar measuring 1.1 x 0.7 x 1.0 cm. There is also a trace of free fluid seen in the cervical canal.  The Endometrium is tri-layered and measures 13.2 mm.  Right Ovary measures 2.6 x 2.4 x 2.7 cm, and appears WNL. Left Ovary measures 3.1 x 1.6 x 2.2 cm, and appears WNL with a 1.0 cm dominant follicle visualized. Survey of the adnexa demonstrates no adnexal masses. There is no free fluid in the cul de sac. Assessment:     Menorrhagia with regular cycles overweight    Plan:     After considering options desires uterine ablation- will schedule appt to consult with Dr Greggory Keenefrancesco in a month or so. Rx sent in lysteda for use until ablation done. Also desires weight loss program we discussed at last visit- counseled on medications, side-effects and expected outcomes. First B12 injection given and rx sent in for adipex.  RTC 4 weeks for BP/B12/ and weight check.   Witten Certain [place], CNM

## 2016-09-14 ENCOUNTER — Other Ambulatory Visit: Payer: Self-pay | Admitting: *Deleted

## 2016-09-14 ENCOUNTER — Encounter: Payer: Self-pay | Admitting: Obstetrics and Gynecology

## 2016-09-14 MED ORDER — TRANEXAMIC ACID 650 MG PO TABS: 1300.0000 mg | ORAL_TABLET | Freq: Three times a day (TID) | ORAL | 2 refills | Status: DC

## 2016-09-25 ENCOUNTER — Telehealth: Payer: Self-pay | Admitting: Obstetrics and Gynecology

## 2016-09-25 NOTE — Telephone Encounter (Signed)
Notified pt it sounds like side effect from phentermine, pt it going to stop medication, and only do 1/2 tablet qd

## 2016-09-25 NOTE — Telephone Encounter (Signed)
Patient has been on phentermine for approx. 3 weeks. She states she is feeling very anxious and tired. If you could return her call. Thanks

## 2016-09-27 ENCOUNTER — Encounter: Payer: Self-pay | Admitting: Obstetrics and Gynecology

## 2016-10-06 ENCOUNTER — Encounter: Payer: PRIVATE HEALTH INSURANCE | Admitting: Obstetrics and Gynecology

## 2017-10-11 ENCOUNTER — Ambulatory Visit (INDEPENDENT_AMBULATORY_CARE_PROVIDER_SITE_OTHER): Payer: PRIVATE HEALTH INSURANCE | Admitting: Obstetrics and Gynecology

## 2017-10-11 ENCOUNTER — Encounter: Payer: Self-pay | Admitting: Obstetrics and Gynecology

## 2017-10-11 VITALS — BP 142/85 | HR 81 | Ht 63.0 in | Wt 135.2 lb

## 2017-10-11 DIAGNOSIS — Z01419 Encounter for gynecological examination (general) (routine) without abnormal findings: Secondary | ICD-10-CM

## 2017-10-11 DIAGNOSIS — N920 Excessive and frequent menstruation with regular cycle: Secondary | ICD-10-CM

## 2017-10-11 NOTE — Progress Notes (Signed)
Subjective:   Mallory Jones is a 47 y.o. G2P2 Caucasian female here for a routine well-woman exam.  Patient's last menstrual period was 10/02/2017.    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  Past Medical History Past Medical History:  Diagnosis Date  . Anxiety   . History of depression   . History of hay fever   . History of hyperlipidemia   . History of sleep apnea   . History of UTI   . Hypertension     Past Surgical History Past Surgical History:  Procedure Laterality Date  . CESAREAN SECTION  2005  . CESAREAN SECTION  2008  . COLONOSCOPY WITH PROPOFOL N/A 10/16/2014   Procedure: COLONOSCOPY WITH PROPOFOL;  Surgeon: Scot Jun, MD;  Location: [facility] ENDOSCOPY;  Service: Endoscopy;  Laterality: N/A;  . DILATION AND CURETTAGE OF UTERUS  2003    Gynecologic History G2P2  Patient's last menstrual period was 10/02/2017. Contraception: coitus interruptus Last Pap: 2018. Results were: normal   Obstetric History OB History  Gravida Para Term Preterm AB Living  2 2          SAB TAB Ectopic Multiple Live Births               # Outcome Date GA Lbr Len/2nd Weight Sex Delivery Anes PTL Lv  2 Para 2008    F CS-Unspec     1 Para 2005    M CS-Unspec       Current Medications Current Outpatient Medications on File Prior to Visit  Medication Sig Dispense Refill  . clomiPRAMINE (ANAFRANIL) 25 MG capsule Take 12.5 mg by mouth at bedtime.    . clonazePAM (KLONOPIN) 0.5 MG tablet  Take 0.25 mg by mouth 3 (three) times daily as needed for anxiety.     . fluvoxaMINE (LUVOX) 100 MG tablet Take 100 mg by mouth at bedtime.     . cyanocobalamin (,VITAMIN B-12,) 1000 MCG/ML injection Inject 1 mL (1,000 mcg total) into the muscle every 30 (thirty) days. (Patient not taking: Reported on 10/11/2017) 10 mL 1  . lisinopril-hydrochlorothiazide (PRINZIDE,ZESTORETIC) 20-25 MG tablet Take 1 tablet by mouth daily.    . phentermine (ADIPEX-P) 37.5 MG tablet Take 1 tablet (37.5 mg total) by mouth daily before breakfast. (Patient not taking: Reported on 10/11/2017) 30 tablet 2  . tranexamic acid (LYSTEDA) 650 MG TABS tablet Take 2 tablets (1,300 mg total) by mouth 3 (three) times daily. Take during menses for a maximum of five days (Patient not taking: Reported on 10/11/2017) 30 tablet 2   No current facility-administered medications on file prior to visit.     Review of Systems Patient denies any headaches, blurred vision, shortness of breath, chest pain, abdominal pain, problems with bowel movements, urination, or intercourse.  Objective:  BP (!) 142/85   Pulse 81   Ht 5\' 3"  (1.6 m)   Wt 135 lb 3.2 oz (61.3 kg)   LMP 10/02/2017   BMI 23.95 kg/m  Physical Exam  General:  Well developed, well nourished, no  acute distress. She is alert and oriented x3. Skin:  Warm and dry Neck:  Midline trachea, no thyromegaly or nodules Cardiovascular: Regular rate and rhythm, no murmur heard Lungs:  Effort normal, all lung fields clear to auscultation bilaterally Breasts:  No dominant palpable mass, retraction, or nipple discharge Abdomen:  Soft, non tender, no hepatosplenomegaly or masses Pelvic:  External genitalia is normal in appearance.  The vagina is normal in appearance. The cervix is bulbous, no CMT.  Thin prep pap is not done. Uterus is felt to be normal size, shape, and contour.  No adnexal masses or tenderness noted. Extremities:  No swelling or varicosities noted Psych:  She has a normal  mood and affect  Assessment:   Healthy well-woman exam menorrhagia  Plan:  Labs obtained- will follow up accordingly Will return 1 month for mirena insertion. F/U 1 year for AE, or sooner if needed Mammogram ordered  Mallory Jones Mallory Jones, CNM

## 2017-10-12 LAB — COMPREHENSIVE METABOLIC PANEL
ALBUMIN: 4.3 g/dL (ref 3.5–5.5)
ALK PHOS: 71 IU/L (ref 39–117)
ALT: 9 IU/L (ref 0–32)
AST: 15 IU/L (ref 0–40)
Albumin/Globulin Ratio: 1.9 (ref 1.2–2.2)
BILIRUBIN TOTAL: 0.3 mg/dL (ref 0.0–1.2)
BUN/Creatinine Ratio: 20 (ref 9–23)
BUN: 16 mg/dL (ref 6–24)
CHLORIDE: 100 mmol/L (ref 96–106)
CO2: 24 mmol/L (ref 20–29)
CREATININE: 0.8 mg/dL (ref 0.57–1.00)
Calcium: 9.3 mg/dL (ref 8.7–10.2)
GFR calc Af Amer: 103 mL/min/{1.73_m2} (ref >59)
GFR calc non Af Amer: 89 mL/min/{1.73_m2} (ref >59)
GLOBULIN, TOTAL: 2.3 g/dL (ref 1.5–4.5)
GLUCOSE: 79 mg/dL (ref 65–99)
Potassium: 4 mmol/L (ref 3.5–5.2)
SODIUM: 138 mmol/L (ref 134–144)
Total Protein: 6.6 g/dL (ref 6.0–8.5)

## 2017-10-12 LAB — CBC
HEMATOCRIT: 32 % — AB (ref 34.0–46.6)
Hemoglobin: 10.2 g/dL — ABNORMAL LOW (ref 11.1–15.9)
MCH: 23.8 pg — AB (ref 26.6–33.0)
MCHC: 31.9 g/dL (ref 31.5–35.7)
MCV: 75 fL — AB (ref 79–97)
Platelets: 253 10*3/uL (ref 150–450)
RBC: 4.29 x10E6/uL (ref 3.77–5.28)
RDW: 15.9 % — ABNORMAL HIGH (ref 12.3–15.4)
WBC: 5.2 10*3/uL (ref 3.4–10.8)

## 2017-10-12 LAB — B12 AND FOLATE PANEL
FOLATE: 9.2 ng/mL (ref >3.0)
Vitamin B-12: 281 pg/mL (ref 232–1245)

## 2017-10-12 LAB — LIPID PANEL
Chol/HDL Ratio: 4.1 ratio (ref 0.0–4.4)
Cholesterol, Total: 195 mg/dL (ref 100–199)
HDL: 48 mg/dL (ref >39)
LDL CALC: 131 mg/dL — AB (ref 0–99)
Triglycerides: 80 mg/dL (ref 0–149)
VLDL CHOLESTEROL CAL: 16 mg/dL (ref 5–40)

## 2017-10-12 LAB — FERRITIN: Ferritin: 8 ng/mL — ABNORMAL LOW (ref 15–150)

## 2017-10-16 ENCOUNTER — Ambulatory Visit
Admission: RE | Admit: 2017-10-16 | Discharge: 2017-10-16 | Disposition: A | Discharge status: Home / Self Care | Payer: PRIVATE HEALTH INSURANCE | Source: Ambulatory Visit | Attending: Obstetrics and Gynecology | Admitting: Obstetrics and Gynecology

## 2017-10-16 DIAGNOSIS — Z01419 Encounter for gynecological examination (general) (routine) without abnormal findings: Secondary | ICD-10-CM

## 2017-10-22 ENCOUNTER — Inpatient Hospital Stay
Admission: RE | Admit: 2017-10-22 | Discharge: 2017-10-22 | Disposition: A | Discharge status: Home / Self Care | Payer: Self-pay | Source: Ambulatory Visit | Attending: *Deleted | Admitting: *Deleted

## 2017-10-22 ENCOUNTER — Other Ambulatory Visit: Payer: Self-pay | Admitting: *Deleted

## 2017-10-22 DIAGNOSIS — Z9289 Personal history of other medical treatment: Secondary | ICD-10-CM

## 2017-11-02 ENCOUNTER — Ambulatory Visit: Payer: PRIVATE HEALTH INSURANCE | Admitting: Obstetrics and Gynecology

## 2017-12-03 ENCOUNTER — Encounter: Payer: Self-pay | Admitting: *Deleted

## 2017-12-03 ENCOUNTER — Telehealth: Payer: Self-pay | Admitting: Obstetrics and Gynecology

## 2017-12-03 NOTE — Telephone Encounter (Signed)
Sent pt mychart message

## 2017-12-03 NOTE — Telephone Encounter (Signed)
The patient called requesting her pap results, please advise, thanks.

## 2018-01-22 ENCOUNTER — Ambulatory Visit
Admission: EM | Admit: 2018-01-22 | Discharge: 2018-01-22 | Disposition: A | Discharge status: Home / Self Care | Payer: PRIVATE HEALTH INSURANCE | Attending: Family Medicine | Admitting: Family Medicine

## 2018-01-22 ENCOUNTER — Encounter: Payer: Self-pay | Admitting: Emergency Medicine

## 2018-01-22 ENCOUNTER — Other Ambulatory Visit: Payer: Self-pay

## 2018-01-22 DIAGNOSIS — J069 Acute upper respiratory infection, unspecified: Secondary | ICD-10-CM | POA: Diagnosis not present

## 2018-01-22 DIAGNOSIS — B9789 Other viral agents as the cause of diseases classified elsewhere: Secondary | ICD-10-CM | POA: Diagnosis not present

## 2018-01-22 MED ORDER — MELOXICAM 15 MG PO TABS: 15.0000 mg | ORAL_TABLET | Freq: Every day | ORAL | 0 refills | Status: DC | PRN

## 2018-01-22 NOTE — ED Triage Notes (Signed)
Pt c/o cough, runny nose, and chills. She reports that it started yesterday. She reports that she can feel the congestion in her chest.

## 2018-01-22 NOTE — ED Provider Notes (Signed)
[facility]    CSN: [id number] Arrival date & time: 01/22/18  1725  History   Chief Complaint Chief Complaint  Patient presents with  . Cough   HPI  47 year old female presents with respiratory symptoms.  Patient reports that she has been sick since Sunday.  Started with runny nose and cough.  She endorses some chills.  No documented fever.  Has now developed severe fatigue and hoarseness.  Patient is a Midwife.  Her most troublesome symptom currently is pain between her shoulder blades.  She reports some body aches as well.  No medications or interventions tried other than increasing her fluid intake.  Symptoms are worsening.  Severe.  No other associated symptoms.  No other complaints.  PMH, Surgical Hx, Family Hx, Social History reviewed and updated as below.  Past Medical History:  Diagnosis Date  . Anxiety   . History of depression   . History of hay fever   . History of hyperlipidemia   . History of sleep apnea   . History of UTI   . Hypertension     Patient Active Problem List   Diagnosis Date Noted  . Family history of colon cancer 07/07/2014  . Colon cancer screening 07/07/2014  . Fatigue 09/22/2013  . PMDD (premenstrual dysphoric disorder) 09/22/2013  . Obstructive sleep apnea 07/04/2013  . Allergic rhinitis 07/04/2013  . Depression with anxiety 07/04/2013  . Frequent UTI 07/04/2013    Past Surgical History:  Procedure Laterality Date  . CESAREAN SECTION  2005  . CESAREAN SECTION  2008  . COLONOSCOPY WITH PROPOFOL N/A 10/16/2014   Procedure: COLONOSCOPY WITH PROPOFOL;  Surgeon: Scot Jun, MD;  Location: [facility] ENDOSCOPY;  Service: Endoscopy;  Laterality: N/A;  . DILATION AND CURETTAGE OF UTERUS  2003    OB History    Gravida  2   Para  2   Term      Preterm      AB      Living        SAB      TAB      Ectopic      Multiple      Live Births               Home Medications    Prior to Admission  medications   Medication Sig Start Date End Date Taking? Authorizing Provider  clomiPRAMINE (ANAFRANIL) 25 MG capsule Take 12.5 mg by mouth at bedtime.   Yes [provider]  clonazePAM (KLONOPIN) 0.5 MG tablet Take 0.25 mg by mouth 3 (three) times daily as needed for anxiety.    Yes [provider]  fluvoxaMINE (LUVOX) 100 MG tablet Take 100 mg by mouth at bedtime.    Yes [provider]  meloxicam (MOBIC) 15 MG tablet Take 1 tablet (15 mg total) by mouth daily as needed for pain. 01/22/18   Tommie Sams, DO    Family History Family History  Problem Relation Age of Onset  . Alcohol abuse Father   . Colon cancer Father   . Hyperlipidemia Father   . Other Father        emotional illness  . Ovarian cancer Maternal Grandmother   . Kidney disease Maternal Grandmother   . Prostate cancer Maternal Grandfather   . Breast cancer Neg Hx     Social History Social History   Tobacco Use  . Smoking status: Never Smoker  . Smokeless tobacco: Never Used  Substance Use Topics  .  Alcohol use: No    Alcohol/week: 0.0 standard drinks  . Drug use: No    Allergies   Patient has no known allergies.  Review of Systems Review of Systems  Constitutional: Positive for chills and fatigue. Negative for fever.  HENT: Positive for voice change.   Respiratory: Positive for cough.   Musculoskeletal: Positive for arthralgias and myalgias.   Physical Exam Triage Vital Signs ED Triage Vitals  Enc Vitals Group     BP 01/22/18 1737 (!) 175/110     Pulse Rate 01/22/18 1737 83     Resp 01/22/18 1737 17     Temp 01/22/18 1737 98.8 F (37.1 C)     Temp Source 01/22/18 1737 Oral     SpO2 01/22/18 1737 100 %     Weight 01/22/18 1735 136 lb (61.7 kg)     Height 01/22/18 1735 5\' 3"  (1.6 m)     Head Circumference --      Peak Flow --      Pain Score 01/22/18 1734 8     Pain Loc --      Pain Edu? --      Excl. in GC? --    Updated Vital Signs BP (!) 165/100 (BP Location:  Left Arm)   Pulse 83   Temp 98.8 F (37.1 C) (Oral)   Resp 17   Ht 5\' 3"  (1.6 m)   Wt 61.7 kg   LMP 12/18/2017   SpO2 100%   BMI 24.09 kg/m   Visual Acuity Right Eye Distance:   Left Eye Distance:   Bilateral Distance:    Right Eye Near:   Left Eye Near:    Bilateral Near:     Physical Exam  Constitutional: She is oriented to person, place, and time. She appears well-developed. No distress.  HENT:  Head: Normocephalic and atraumatic.  Eyes: Conjunctivae are normal. Right eye exhibits no discharge. Left eye exhibits no discharge.  Cardiovascular: Normal rate and regular rhythm.  Pulmonary/Chest: Effort normal and breath sounds normal. She has no wheezes. She has no rales.  Neurological: She is alert and oriented to person, place, and time. She displays normal reflexes.  Psychiatric: She has a normal mood and affect. Her behavior is normal.  Nursing note and vitals reviewed.  UC Treatments / Results  Labs (all labs ordered are listed, but only abnormal results are displayed) Labs Reviewed - No data to display  EKG None  Radiology No results found.  Procedures Procedures (including critical care time)  Medications Ordered in UC Medications - No data to display  Initial Impression / Assessment and Plan / UC Course  I have reviewed the triage vital signs and the nursing notes.  Pertinent labs & imaging results that were available during my care of the patient were reviewed by me and considered in my medical decision making (see chart for details).    47 year old female presents with a viral URI with cough.  Rest.  Fluids.  Patient declined cough medication.  Mobic for her discomfort.  Work note given.  Final Clinical Impressions(s) / UC Diagnoses   Final diagnoses:  Viral URI with cough     Discharge Instructions     Rest, fluids.  Use the mobic as needed for your pain. OTC Robitussin if needed for cough.  Take care  Dr. Adriana Simas    ED Prescriptions      Medication Sig Dispense Auth. Provider   meloxicam (MOBIC) 15 MG tablet Take 1 tablet (15 mg  total) by mouth daily as needed for pain. 30 tablet Tommie Sams, DO     Controlled Substance Prescriptions [place] Controlled Substance Registry consulted? Not Applicable   Tommie Sams, DO 01/22/18 1610

## 2018-01-22 NOTE — Discharge Instructions (Signed)
Rest, fluids.  Use the mobic as needed for your pain. OTC Robitussin if needed for cough.  Take care  Dr. Adriana Simas

## 2018-01-24 ENCOUNTER — Other Ambulatory Visit: Payer: Self-pay | Admitting: Psychiatry

## 2018-02-28 ENCOUNTER — Encounter: Payer: Self-pay | Admitting: Emergency Medicine

## 2018-02-28 DIAGNOSIS — F429 Obsessive-compulsive disorder, unspecified: Secondary | ICD-10-CM | POA: Insufficient documentation

## 2018-03-08 ENCOUNTER — Ambulatory Visit: Payer: Self-pay | Admitting: Psychiatry

## 2018-03-26 ENCOUNTER — Telehealth: Payer: Self-pay | Admitting: Obstetrics and Gynecology

## 2018-03-26 NOTE — Telephone Encounter (Signed)
Spoke with pt she states she feels "fine", advised her if happened again that she needs to be seen, she voiced understanding

## 2018-03-26 NOTE — Telephone Encounter (Signed)
The patient called and stated that she has a few serious concerns in regards to her having some serious clotting, feeling weak and passing out, and also her BP has dropped and the feeling of weakness comes and goes. The patient is unsure if this is something that is normal or should be expected. The patient is hoping for a call back if possible today. Please advise.

## 2018-04-03 ENCOUNTER — Other Ambulatory Visit: Payer: PRIVATE HEALTH INSURANCE

## 2018-04-03 ENCOUNTER — Telehealth: Payer: Self-pay | Admitting: Obstetrics and Gynecology

## 2018-04-03 DIAGNOSIS — N390 Urinary tract infection, site not specified: Secondary | ICD-10-CM

## 2018-04-03 NOTE — Telephone Encounter (Signed)
Spoke with patient, scheduled for 11:15 am 1/15 to do urine drop-off.

## 2018-04-03 NOTE — Telephone Encounter (Signed)
The patient states she is having some UTI symptoms, and would like a call back to discuss options for treatment, ie appointment, drop off or call in medication. Please advise, thanks.

## 2018-04-03 NOTE — Telephone Encounter (Signed)
Yes she can do a urine drop off, put on lab schedule

## 2018-04-03 NOTE — Telephone Encounter (Signed)
Urine culture sent out 04/03/18

## 2018-04-04 ENCOUNTER — Telehealth: Payer: Self-pay | Admitting: Obstetrics and Gynecology

## 2018-04-04 ENCOUNTER — Encounter: Payer: Self-pay | Admitting: *Deleted

## 2018-04-04 NOTE — Telephone Encounter (Signed)
The patient called and stated that she wants to check the status of her results. The patient is hoping to receive a call back if possible. Please advise.

## 2018-04-04 NOTE — Telephone Encounter (Signed)
Sent pt mychart message

## 2018-04-05 ENCOUNTER — Ambulatory Visit: Payer: Self-pay | Admitting: Psychiatry

## 2018-04-06 LAB — URINE CULTURE

## 2018-04-08 ENCOUNTER — Telehealth: Payer: Self-pay | Admitting: Obstetrics and Gynecology

## 2018-04-08 ENCOUNTER — Other Ambulatory Visit: Payer: Self-pay | Admitting: *Deleted

## 2018-04-08 MED ORDER — NITROFURANTOIN MONOHYD MACRO 100 MG PO CAPS: 100.0000 mg | ORAL_CAPSULE | Freq: Two times a day (BID) | ORAL | 1 refills | Status: DC

## 2018-04-08 NOTE — Telephone Encounter (Signed)
The patient called and stated that she never got a call back in regards to her results from her urine that she recently did. The patient is requesting a call back today with an update on the status of her results if possible toady. Please advise.

## 2018-04-08 NOTE — Telephone Encounter (Signed)
Called pt she grew out [place], sent pt in medication.

## 2018-04-09 ENCOUNTER — Telehealth: Payer: Self-pay

## 2018-04-09 NOTE — Telephone Encounter (Signed)
Pt called to report her prescription for the antibiotic was too expensive. After speaking with the patient a call was placed to [facility] in [place]. I spoke with the pharmacist who ran a GoodRx card through. However the amount was $34.00. A coupon found on GoodRx website was used and the total was $18.87. Patient was informed of lower price and that she could go pick it up.

## 2018-04-10 ENCOUNTER — Telehealth: Payer: Self-pay | Admitting: Psychiatry

## 2018-04-10 NOTE — Telephone Encounter (Signed)
Pt prescribed Macrobid and wanted to verify no interaction, checked with provider and confirmed. Ok to take with her current medications.

## 2018-04-10 NOTE — Telephone Encounter (Signed)
Pt. left v-mail about advise on meds interaction. Please advise

## 2018-04-15 ENCOUNTER — Other Ambulatory Visit: Payer: Self-pay | Admitting: Psychiatry

## 2018-04-16 NOTE — Telephone Encounter (Signed)
Has ov tomorrow.

## 2018-04-17 ENCOUNTER — Ambulatory Visit: Payer: Self-pay | Admitting: Psychiatry

## 2018-04-17 DIAGNOSIS — F429 Obsessive-compulsive disorder, unspecified: Secondary | ICD-10-CM

## 2018-04-17 MED ORDER — CLONAZEPAM 0.5 MG PO TABS: 0.5000 mg | ORAL_TABLET | Freq: Two times a day (BID) | ORAL | 2 refills | Status: DC

## 2018-04-17 MED ORDER — CLOMIPRAMINE HCL 25 MG PO CAPS: 25.0000 mg | ORAL_CAPSULE | Freq: Every day | ORAL | 3 refills | Status: DC

## 2018-04-17 NOTE — Progress Notes (Signed)
       [facility]  Patient ID: Mallory Jones,  MRN: [id number]  PCP: Danella Penton, MD  Date of Evaluation: 04/17/2018 Time spent:20 minutes  Chief Complaint:   HISTORY/CURRENT STATUS: HPI patient has diagnosis of OCD.  Has noticed increased anxiety lately.  Her father is an alcoholic and is in rehab.  She has mild panic attacks. No depression or OCD. Medication side effects--none    Allergies: Patient has no known allergies.  Current Medications:  Current Outpatient Medications:  .  clomiPRAMINE (ANAFRANIL) 25 MG capsule, Take 12.5 mg by mouth at bedtime., Disp: , Rfl:  .  clonazePAM (KLONOPIN) 0.5 MG tablet, Take 0.25 mg by mouth 3 (three) times daily as needed for anxiety. , Disp: , Rfl:  .  fluvoxaMINE (LUVOX) 100 MG tablet, TAKE 2 TABLETS BY MOUTH ONCE DAILY, Disp: 60 tablet, Rfl: 3 .  meloxicam (MOBIC) 15 MG tablet, Take 1 tablet (15 mg total) by mouth daily as needed for pain., Disp: 30 tablet, Rfl: 0 .  nitrofurantoin, macrocrystal-monohydrate, (MACROBID) 100 MG capsule, Take 1 capsule (100 mg total) by mouth 2 (two) times daily., Disp: 14 capsule, Rfl: 1 Medication Side Effects: none none Family Medical/ Social History: Changes? no  MENTAL HEALTH EXAM:  There were no vitals taken for this visit.There is no height or weight on file to calculate BMI.  General Appearance: Casual  Eye Contact:  Good  Speech:  Clear and Coherent  Volume:  Normal  Mood:  Euthymic  Affect:  Appropriate  Thought Process:  Linear  Orientation:  Full (Time, Place, and Person)  Thought Content: Logical   Suicidal Thoughts:  No  Homicidal Thoughts:  No  Memory:  WNL  Judgement:  Good  Insight:  Good  Psychomotor Activity:  Normal  Concentration:  Concentration: Good  Recall:  Good  Fund of Knowledge: Good  Language: Good  Assets:  Social Support  ADL's:  Intact  Cognition: WNL  Prognosis:  Good    DIAGNOSES: No diagnosis found.  Receiving Psychotherapy: No     RECOMMENDATIONS: Patient is to continue her current meds.  She takes clomipramine 25 mg a day, Klonopin 0.5 mg she can take 1/2-1 twice a day.  She is to continue her Luvox currently she has a 250 mg. We will have her do the sodium level. Return in 4 months.   Anne Fu, PA-C

## 2018-08-22 ENCOUNTER — Ambulatory Visit: Payer: Self-pay | Admitting: Psychiatry

## 2018-09-04 ENCOUNTER — Ambulatory Visit: Payer: Self-pay | Admitting: Physician Assistant

## 2018-09-26 ENCOUNTER — Ambulatory Visit: Payer: Self-pay | Admitting: Physician Assistant

## 2018-10-28 ENCOUNTER — Other Ambulatory Visit: Payer: Self-pay

## 2018-10-28 ENCOUNTER — Ambulatory Visit: Payer: PRIVATE HEALTH INSURANCE | Admitting: Physician Assistant

## 2018-12-09 ENCOUNTER — Other Ambulatory Visit: Payer: Self-pay

## 2018-12-09 ENCOUNTER — Encounter: Payer: Self-pay | Admitting: Physician Assistant

## 2018-12-09 ENCOUNTER — Ambulatory Visit (INDEPENDENT_AMBULATORY_CARE_PROVIDER_SITE_OTHER): Payer: PRIVATE HEALTH INSURANCE | Admitting: Physician Assistant

## 2018-12-09 DIAGNOSIS — F429 Obsessive-compulsive disorder, unspecified: Secondary | ICD-10-CM | POA: Diagnosis not present

## 2018-12-09 MED ORDER — FLUVOXAMINE MALEATE 100 MG PO TABS: 200.0000 mg | ORAL_TABLET | Freq: Every day | ORAL | 3 refills | Status: DC

## 2018-12-09 NOTE — Progress Notes (Signed)
[facility]  Patient ID: Mallory Jones,  MRN: [id number]  PCP: Danella Penton, MD  Date of Evaluation: 12/09/2018 Time spent:15 minutes  Chief Complaint:  Chief Complaint    Follow-up     Virtual Visit via Telephone Note  I connected with patient by a video enabled telemedicine application or telephone, with their informed consent, and verified patient privacy and that I am speaking with the correct person using two identifiers.  I am private, in my office and the patient is at work.   I discussed the limitations, risks, security and privacy concerns of performing an evaluation and management service by telephone and the availability of in person appointments. I also discussed with the patient that there may be a patient responsible charge related to this service. The patient expressed understanding and agreed to proceed.   I discussed the assessment and treatment plan with the patient. The patient was provided an opportunity to ask questions and all were answered. The patient agreed with the plan and demonstrated an understanding of the instructions.   The patient was advised to call back or seek an in-person evaluation if the symptoms worsen or if the condition fails to improve as anticipated.  I provided 15 minutes of non-face-to-face time during this encounter.  HISTORY/CURRENT STATUS: HPI For routine med check.  Transferring to my care after her provider and my colleague, Anne Fu, [place], passed away.   States she is doing really well.  The OCD is stable.  She is taking Luvox 150 mg total and it is still effective.  She will sometimes have obsessive thoughts about something but she is able to use the tools that she has learned to help get through things.  No compulsions to speak of.  Anxiety is well controlled.  She rarely takes the Klonopin.  She is able to enjoy things.  Energy and motivation are good.  She is not isolating.  Denies suicidal or homicidal  thoughts.  Denies dizziness, syncope, seizures, numbness, tingling, tremor, tics, unsteady gait, slurred speech, confusion. Denies muscle or joint pain, stiffness, or dystonia.  Individual Medical History/ Review of Systems: Changes? :No    Past medications for mental health diagnoses include: unknown  Allergies: Patient has no known allergies.  Current Medications:  Current Outpatient Medications:  .  clomiPRAMINE (ANAFRANIL) 25 MG capsule, Take 1 capsule (25 mg total) by mouth at bedtime., Disp: 30 capsule, Rfl: 3 .  clonazePAM (KLONOPIN) 0.5 MG tablet, Take 1 tablet (0.5 mg total) by mouth 2 (two) times daily., Disp: 50 tablet, Rfl: 2 .  cyanocobalamin (,VITAMIN B-12,) 1000 MCG/ML injection, Inject 324 mg into the muscle., Disp: , Rfl:  .  fluvoxaMINE (LUVOX) 100 MG tablet, Take 2 tablets (200 mg total) by mouth at bedtime., Disp: 60 tablet, Rfl: 3 .  meloxicam (MOBIC) 15 MG tablet, Take 1 tablet (15 mg total) by mouth daily as needed for pain. (Patient not taking: Reported on 12/09/2018), Disp: 30 tablet, Rfl: 0 .  nitrofurantoin, macrocrystal-monohydrate, (MACROBID) 100 MG capsule, Take 1 capsule (100 mg total) by mouth 2 (two) times daily. (Patient not taking: Reported on 12/09/2018), Disp: 14 capsule, Rfl: 1 Medication Side Effects: none  Family Medical/ Social History: Changes? No  MENTAL HEALTH EXAM:  There were no vitals taken for this visit.There is no height or weight on file to calculate BMI.  General Appearance: Unable to assess  Eye Contact:  Unable to assess  Speech:  Clear and Coherent  Volume:  Normal  Mood:  Euthymic  Affect:  Unable to assess  Thought Process:  Goal Directed and Descriptions of Associations: Intact  Orientation:  Full (Time, Place, and Person)  Thought Content: Logical   Suicidal Thoughts:  No  Homicidal Thoughts:  No  Memory:  WNL  Judgement:  Good  Insight:  Good  Psychomotor Activity:  Unable to assess  Concentration:  Concentration: Good   Recall:  Good  Fund of Knowledge: Good  Language: Good  Assets:  Desire for Improvement  ADL's:  Intact  Cognition: WNL  Prognosis:  Good    DIAGNOSES:    ICD-10-CM   1. Obsessive-compulsive disorder, unspecified type  F42.9     Receiving Psychotherapy: No    RECOMMENDATIONS:  PDMP was reviewed. Continue Luvox 150 mg p.o. daily.  For now, I am leaving the prescription written as 200 mg daily.  She verbalizes understanding on the dosage. Continue clomipramine 25 mg nightly. Continue Klonopin 0.5 mg 1 twice daily as needed.  She rarely takes. Just for my information: She is ordering the clomipramine from the [place] because when she first started it it was a lot cheaper.  We discussed good Rx and at [facility] it is around $70 a month right now.  That is much cheaper than a few years ago.  Since she is doing well on this current med from the [place] and it is affordable we decided to continue to get it from there for the time being. Return in 4 months.  Donnal Moat, PA-C

## 2019-07-01 ENCOUNTER — Ambulatory Visit (INDEPENDENT_AMBULATORY_CARE_PROVIDER_SITE_OTHER): Payer: BLUE CROSS/BLUE SHIELD | Admitting: Physician Assistant

## 2019-07-01 ENCOUNTER — Encounter: Payer: Self-pay | Admitting: Physician Assistant

## 2019-07-01 ENCOUNTER — Other Ambulatory Visit: Payer: Self-pay

## 2019-07-01 DIAGNOSIS — F429 Obsessive-compulsive disorder, unspecified: Secondary | ICD-10-CM

## 2019-07-01 MED ORDER — FLUVOXAMINE MALEATE 100 MG PO TABS: 100.0000 mg | ORAL_TABLET | Freq: Every day | ORAL | 5 refills | Status: DC

## 2019-07-01 NOTE — Progress Notes (Signed)
[facility]  Patient ID: SELDA JALBERT,  MRN: [id number]  PCP: Rusty Aus, MD  Date of Evaluation: 07/01/2019 Time spent:20 minutes  Chief Complaint:  Chief Complaint    Follow-up      HISTORY/CURRENT STATUS: HPI for routine med check.  Patient is here for medication refill.  She takes clomipramine and Luvox for severe OCD.  States she is doing very well on the current regimen that wonders if she could either decrease the clomipramine or the Luvox.  Does not want to take more medication than she has to.  She also takes Klonopin but very rarely.  She is not having ruminating thoughts hardly at all.  When she does, she is able to redirect her thoughts which helps.  No compulsions.  She is able to enjoy things.  Energy and motivation are good.  She does not isolate.  Does not cry easily.  No suicidal or homicidal thoughts.  Denies dizziness, syncope, seizures, numbness, tingling, tremor, tics, unsteady gait, slurred speech, confusion. Denies muscle or joint pain, stiffness, or dystonia.  Individual Medical History/ Review of Systems: Changes? :No   Allergies: Patient has no known allergies.  Current Medications:  Current Outpatient Medications:  .  clomiPRAMINE (ANAFRANIL) 25 MG capsule, Take 1 capsule (25 mg total) by mouth at bedtime., Disp: 30 capsule, Rfl: 3 .  clonazePAM (KLONOPIN) 0.5 MG tablet, Take 1 tablet (0.5 mg total) by mouth 2 (two) times daily., Disp: 50 tablet, Rfl: 2 .  fluvoxaMINE (LUVOX) 100 MG tablet, Take 2 tablets (200 mg total) by mouth at bedtime. (Patient taking differently: Take 150 mg by mouth at bedtime. ), Disp: 60 tablet, Rfl: 3 .  cyanocobalamin (,VITAMIN B-12,) 1000 MCG/ML injection, Inject 324 mg into the muscle., Disp: , Rfl:  .  fluvoxaMINE (LUVOX) 100 MG tablet, Take 1 tablet (100 mg total) by mouth at bedtime., Disp: 30 tablet, Rfl: 5 .  meloxicam (MOBIC) 15 MG tablet, Take 1 tablet (15 mg total) by mouth daily as needed for  pain. (Patient not taking: Reported on 12/09/2018), Disp: 30 tablet, Rfl: 0 .  nitrofurantoin, macrocrystal-monohydrate, (MACROBID) 100 MG capsule, Take 1 capsule (100 mg total) by mouth 2 (two) times daily. (Patient not taking: Reported on 12/09/2018), Disp: 14 capsule, Rfl: 1 Medication Side Effects: none  Family Medical/ Social History: Changes? No  MENTAL HEALTH EXAM:  There were no vitals taken for this visit.There is no height or weight on file to calculate BMI.  General Appearance: Casual, Neat and Well Groomed  Eye Contact:  Good  Speech:  Clear and Coherent  Volume:  Normal  Mood:  Euthymic  Affect:  Appropriate  Thought Process:  Goal Directed and Descriptions of Associations: Intact  Orientation:  Full (Time, Place, and Person)  Thought Content: Logical   Suicidal Thoughts:  No  Homicidal Thoughts:  No  Memory:  WNL  Judgement:  Good  Insight:  Good  Psychomotor Activity:  Normal  Concentration:  Concentration: Good  Recall:  Good  Fund of Knowledge: Good  Language: Good  Assets:  Desire for Improvement  ADL's:  Intact  Cognition: WNL  Prognosis:  Good    DIAGNOSES:    ICD-10-CM   1. Obsessive-compulsive disorder, unspecified type  F42.9     Receiving Psychotherapy: No    RECOMMENDATIONS:  PDMP was reviewed. I spent 15 minutes with her. I am glad to see her doing so well. We discussed decreasing the Luvox.  This is fine to try but  if she has ruminating thoughts that worsen, will need to increase it back up. Decrease Luvox to 100 mg p.o. nightly. Continue clomipramine 25 mg 1 nightly. Continue Klonopin 0.5 mg 1 p.o. twice daily as needed. Return in 2 months.  Melony Overly, PA-C

## 2019-09-02 ENCOUNTER — Ambulatory Visit: Payer: BLUE CROSS/BLUE SHIELD | Admitting: Physician Assistant

## 2019-09-15 ENCOUNTER — Other Ambulatory Visit: Payer: Self-pay | Admitting: Internal Medicine

## 2019-09-15 ENCOUNTER — Other Ambulatory Visit: Payer: Self-pay

## 2019-09-15 ENCOUNTER — Ambulatory Visit
Admission: RE | Admit: 2019-09-15 | Discharge: 2019-09-15 | Disposition: A | Discharge status: Home / Self Care | Payer: BLUE CROSS/BLUE SHIELD | Source: Ambulatory Visit | Attending: Internal Medicine | Admitting: Internal Medicine

## 2019-09-15 DIAGNOSIS — Z1231 Encounter for screening mammogram for malignant neoplasm of breast: Secondary | ICD-10-CM | POA: Diagnosis present

## 2019-09-17 ENCOUNTER — Telehealth: Payer: Self-pay | Admitting: Certified Nurse Midwife

## 2019-09-17 ENCOUNTER — Telehealth: Payer: Self-pay

## 2019-09-17 ENCOUNTER — Encounter: Payer: PRIVATE HEALTH INSURANCE | Admitting: Certified Nurse Midwife

## 2019-09-17 NOTE — Telephone Encounter (Signed)
Pt called in and stated that she needs to move her appt with Pattricia Boss for today to a to a later time. The pt hasnt been seen since 2019 with Mel. The pt wanted to know if she could see another provider today I told her i would ask. The pt is wanting some meds to stop her period the pt is going out of town this weekend. I ask JW and she stated that there is no same say appts and to ask the pt is she going to est care with Dr. Logan Bores or just see him for this visit. The pt stated she wasn't to see a women. I found her an appt with JML for tomorrow

## 2019-09-17 NOTE — Telephone Encounter (Signed)
Message sent to Serafina Royals CNM

## 2019-09-18 ENCOUNTER — Encounter: Payer: Self-pay | Admitting: Certified Nurse Midwife

## 2019-09-18 ENCOUNTER — Ambulatory Visit (INDEPENDENT_AMBULATORY_CARE_PROVIDER_SITE_OTHER): Payer: BLUE CROSS/BLUE SHIELD | Admitting: Certified Nurse Midwife

## 2019-09-18 ENCOUNTER — Other Ambulatory Visit: Payer: Self-pay

## 2019-09-18 ENCOUNTER — Ambulatory Visit: Payer: BLUE CROSS/BLUE SHIELD | Admitting: Physician Assistant

## 2019-09-18 VITALS — BP 139/87 | HR 73 | Ht 63.0 in | Wt 134.4 lb

## 2019-09-18 DIAGNOSIS — N951 Menopausal and female climacteric states: Secondary | ICD-10-CM | POA: Diagnosis not present

## 2019-09-18 DIAGNOSIS — Z8679 Personal history of other diseases of the circulatory system: Secondary | ICD-10-CM | POA: Diagnosis not present

## 2019-09-18 NOTE — Patient Instructions (Addendum)
Ethinyl Estradiol; Norethindrone Acetate; Ferrous fumarate tablets or capsules What is this medicine? ETHINYL ESTRADIOL; NORETHINDRONE ACETATE; FERROUS FUMARATE (ETH in il es tra DYE ole; nor eth IN drone AS e tate; FER us FUE ma rate) is an oral contraceptive. The products combine two types of female hormones, an estrogen and a progestin. They are used to prevent ovulation and pregnancy. Some products are also used to treat acne in females. This medicine may be used for other purposes; ask your health care provider or pharmacist if you have questions. COMMON BRAND NAME(S): Aurovela 24 Fe 1/20, Aurovela Fe, Blisovi 24 Fe, Blisovi Fe, Estrostep Fe, Gildess 24 Fe, Gildess Fe 1.5/30, Gildess Fe 1/20, Hailey 24 Fe, Hailey Fe 1.5/30, Junel Fe 1.5/30, Junel Fe 1/20, Junel Fe 24, Larin Fe, Lo Loestrin Fe, Loestrin 24 Fe, Loestrin FE 1.5/30, Loestrin FE 1/20, Lomedia 24 Fe, Microgestin 24 Fe, Microgestin Fe 1.5/30, Microgestin Fe 1/20, Tarina 24 Fe, Tarina Fe 1/20, Taytulla, Tilia Fe, Tri-Legest Fe What should I tell my health care provider before I take this medicine? They need to know if you have any of these conditions:  abnormal vaginal bleeding  blood vessel disease  breast, cervical, endometrial, ovarian, liver, or uterine cancer  diabetes  gallbladder disease  heart disease or recent heart attack  high blood pressure  high cholesterol  history of blood clots  kidney disease  liver disease  migraine headaches  smoke tobacco  stroke  systemic lupus erythematosus (SLE)  an unusual or allergic reaction to estrogens, progestins, other medicines, foods, dyes, or preservatives  pregnant or trying to get pregnant  breast-feeding How should I use this medicine? Take this medicine by mouth. To reduce nausea, this medicine may be taken with food. Follow the directions on the prescription label. Take this medicine at the same time each day and in the order directed on the package. Do  not take your medicine more often than directed. A patient package insert for the product will be given with each prescription and refill. Read this sheet carefully each time. The sheet may change frequently. Contact your pediatrician regarding the use of this medicine in children. Special care may be needed. This medicine has been used in female children who have started having menstrual periods. Overdosage: If you think you have taken too much of this medicine contact a poison control center or emergency room at once. NOTE: This medicine is only for you. Do not share this medicine with others. What if I miss a dose? If you miss a dose, refer to the patient information sheet you received with your medicine for direction. If you miss more than one pill, this medicine may not be as effective and you may need to use another form of birth control. What may interact with this medicine? Do not take this medicine with the following medication:  dasabuvir; ombitasvir; paritaprevir; ritonavir  ombitasvir; paritaprevir; ritonavir This medicine may also interact with the following medications:  acetaminophen  antibiotics or medicines for infections, especially rifampin, rifabutin, rifapentine, and griseofulvin, and possibly penicillins or tetracyclines  aprepitant  ascorbic acid (vitamin C)  atorvastatin  barbiturate medicines, such as phenobarbital  bosentan  carbamazepine  caffeine  clofibrate  cyclosporine  dantrolene  doxercalciferol  felbamate  grapefruit juice  hydrocortisone  medicines for anxiety or sleeping problems, such as diazepam or temazepam  medicines for diabetes, including pioglitazone  mineral oil  modafinil  mycophenolate  nefazodone  oxcarbazepine  phenytoin  prednisolone  ritonavir or other medicines for   HIV infection or AIDS  rosuvastatin  selegiline  soy isoflavones supplements  St. John's wort  tamoxifen or  raloxifene  theophylline  thyroid hormones  topiramate  warfarin This list may not describe all possible interactions. Give your health care provider a list of all the medicines, herbs, non-prescription drugs, or dietary supplements you use. Also tell them if you smoke, drink alcohol, or use illegal drugs. Some items may interact with your medicine. What should I watch for while using this medicine? Visit your doctor or health care professional for regular checks on your progress. You will need a regular breast and pelvic exam and Pap smear while on this medicine. Use an additional method of contraception during the first cycle that you take these tablets. If you have any reason to think you are pregnant, stop taking this medicine right away and contact your doctor or health care professional. If you are taking this medicine for hormone related problems, it may take several cycles of use to see improvement in your condition. Smoking increases the risk of getting a blood clot or having a stroke while you are taking birth control pills, especially if you are more than 47 years old. You are strongly advised not to smoke. This medicine can make your body retain fluid, making your fingers, hands, or ankles swell. Your blood pressure can go up. Contact your doctor or health care professional if you feel you are retaining fluid. This medicine can make you more sensitive to the sun. Keep out of the sun. If you cannot avoid being in the sun, wear protective clothing and use sunscreen. Do not use sun lamps or tanning beds/booths. If you wear contact lenses and notice visual changes, or if the lenses begin to feel uncomfortable, consult your eye care specialist. In some women, tenderness, swelling, or minor bleeding of the gums may occur. Notify your dentist if this happens. Brushing and flossing your teeth regularly may help limit this. See your dentist regularly and inform your dentist of the medicines you  are taking. If you are going to have elective surgery, you may need to stop taking this medicine before the surgery. Consult your health care professional for advice. This medicine does not protect you against HIV infection (AIDS) or any other sexually transmitted diseases. What side effects may I notice from receiving this medicine? Side effects that you should report to your doctor or health care professional as soon as possible:  allergic reactions like skin rash, itching or hives, swelling of the face, lips, or tongue  breast tissue changes or discharge  changes in vaginal bleeding during your period or between your periods  changes in vision  chest pain  confusion  coughing up blood  dizziness  feeling faint or lightheaded  headaches or migraines  leg, arm or groin pain  loss of balance or coordination  severe or sudden headaches  stomach pain (severe)  sudden shortness of breath  sudden numbness or weakness of the face, arm or leg  symptoms of vaginal infection like itching, irritation or unusual discharge  tenderness in the upper abdomen  trouble speaking or understanding  vomiting  yellowing of the eyes or skin Side effects that usually do not require medical attention (report to your doctor or health care professional if they continue or are bothersome):  breakthrough bleeding and spotting that continues beyond the 3 initial cycles of pills  breast tenderness  mood changes, anxiety, depression, frustration, anger, or emotional outbursts  increased sensitivity to sun  or ultraviolet light  nausea  skin rash, acne, or brown spots on the skin  weight gain (slight) This list may not describe all possible side effects. Call your doctor for medical advice about side effects. You may report side effects to FDA at 1-800-FDA-1088. Where should I keep my medicine? Keep out of the reach of children. Store at room temperature between 15 and 30 degrees C  (59 and 86 degrees F). Throw away any unused medicine after the expiration date. NOTE: This sheet is a summary. It may not cover all possible information. If you have questions about this medicine, talk to your doctor, pharmacist, or health care provider.  2020 Elsevier/Gold Standard (2015-11-15 08:04:41)    Drospirenone tablets (contraception) What is this medicine? DROSPIRENONE (dro SPY re nown) is an oral contraceptive (birth control pill). The product contains a female hormone known as a progestin. It is used to prevent pregnancy. This medicine may be used for other purposes; ask your health care provider or pharmacist if you have questions. COMMON BRAND NAME(S): Slynd What should I tell my health care provider before I take this medicine? They need to know if you have any of these conditions:  abnormal vaginal bleeding  adrenal gland disease  blood vessel disease or blood clots  breast, cervical, endometrial, ovarian, liver, or uterine cancer  diabetes  heart disease or recent heart attack  high potassium level  kidney disease  liver disease  mental depression  migraine headaches  stroke  an unusual or allergic reaction to drospirenone, progestins, or other medicines, foods, dyes, or preservatives  pregnant or trying to get pregnant  breast-feeding How should I use this medicine? Take this medicine by mouth. To reduce nausea, this medicine may be taken with food. Follow the directions on the prescription label. Take this medicine at the same time each day and in the order directed on the package. Do not take your medicine more often than directed. A patient package insert for the product will be given with each prescription and refill. Read this sheet carefully each time. The sheet may change frequently. Talk to your pediatrician regarding the use of this medicine in children. Special care may be needed. This medicine has been used in female children who have  started having menstrual periods. Overdosage: If you think you have taken too much of this medicine contact a poison control center or emergency room at once. NOTE: This medicine is only for you. Do not share this medicine with others. What if I miss a dose? If you miss a dose, take it as soon as you can and refer to the patient information sheet you received with your medicine for direction. If you miss more than one pill, this medicine may not be as effective and you may need to use another form of birth control. What may interact with this medicine? Do not take this medicine with any of the following medications:  atazanavir; cobicistat  bosentan  fosamprenavir This medicine may also interact with the following medications:  aprepitant  barbiturates like phenobarbital, primidone  carbamazepine  certain antibiotics like clarithromycin, rifampin, rifabutin, rifapentine  certain antivirals for HIV or hepatitis  certain diuretics like amiloride, spironolactone, triamterene  certain medicines for fungal infections like griseofulvin, ketoconazole, itraconazole, voriconazole  certain medicines for blood pressure, heart disease  cyclosporine  felbamate  heparin  medicines for diabetes  modafinil  NSAIDs, medicines for pain and inflammation, like ibuprofen or naproxen  oxcarbazepine  phenytoin  potassium supplements  rufinamide  St. John's wort  topiramate This list may not describe all possible interactions. Give your health care provider a list of all the medicines, herbs, non-prescription drugs, or dietary supplements you use. Also tell them if you smoke, drink alcohol, or use illegal drugs. Some items may interact with your medicine. What should I watch for while using this medicine? Visit your doctor or health care professional for regular checks on your progress. You will need a regular breast and pelvic exam and Pap smear while on this medicine. You may need  blood work done while you are taking this medicine. If you have any reason to think you are pregnant, stop taking this medicine right away and contact your doctor or health care professional. This medicine does not protect you against HIV infection (AIDS) or any other sexually transmitted diseases. If you are going to have elective surgery, you may need to stop taking this medicine before the surgery. Consult your health care professional for advice. What side effects may I notice from receiving this medicine? Side effects that you should report to your doctor or health care professional as soon as possible:  allergic reactions like skin rash, itching or hives, swelling of the face, lips, or tongue  breast tissue changes or discharge  depressed mood  severe pain, swelling, or tenderness in the abdomen  signs and symptoms of a blood clot such as chest pain; shortness of breath; pain, swelling, or warmth in the leg  signs and symptoms of increased potassium like muscle weakness; chest pain; or fast, irregular heartbeat  signs and symptoms of liver injury like dark yellow or brown urine; general ill feeling or flu-like symptoms; light-colored stools; loss of appetite; nausea; right upper belly pain; unusually weak or tired; yellowing of the eyes or skin  signs and symptoms of a stroke like changes in vision; confusion; trouble speaking or understanding; severe headaches; sudden numbness or weakness of the face, arm or leg; trouble walking; dizziness; loss of balance or coordination  unusual vaginal bleeding  unusually weak or tired Side effects that usually do not require medical attention (report these to your doctor or health care professional if they continue or are bothersome):  acne  breast tenderness  headache  menstrual cramps  nausea  weight gain This list may not describe all possible side effects. Call your doctor for medical advice about side effects. You may report  side effects to FDA at 1-800-FDA-1088. Where should I keep my medicine? Keep out of the reach of children. Store at room temperature between 20 and 25 degrees C (68 and 77 degrees F). Throw away any unused medicine after the expiration date. NOTE: This sheet is a summary. It may not cover all possible information. If you have questions about this medicine, talk to your doctor, pharmacist, or health care provider.  2020 Elsevier/Gold Standard (2017-08-15 15:01:56)

## 2019-09-18 NOTE — Telephone Encounter (Signed)
Patient already scheduled for visit today. Will see then. Thanks, JML

## 2019-09-24 NOTE — Progress Notes (Signed)
GYN ENCOUNTER NOTE  Subjective:       Mallory Jones is a 49 y.o. G2P2 female here to discuss menses management options.   Scheduled to leave for beach this week and believes "period will start".  Reports periods that are "uncontainable" with tampons.   Denies difficulty breathing or respiratory distress, chest pain, abdominal pain, excessive vaginal bleeding, dysuria, and leg pain or swelling.    Gynecologic History  Patient's last menstrual period was 08/26/2019 (exact date).  Contraception: coitus interruptus  Last Pap: 08/2016. Results were: Neg/Neg  Last mammogram: 08/2019. Results were: BI-RADS 1, Negative  Obstetric History OB History  Gravida Para Term Preterm AB Living  2 2          SAB TAB Ectopic Multiple Live Births               # Outcome Date GA Lbr Len/2nd Weight Sex Delivery Anes PTL Lv  2 Para 2008    F CS-Unspec     1 Para 2005    M CS-Unspec       Past Medical History:  Diagnosis Date  . Anxiety   . History of depression   . History of hay fever   . History of hyperlipidemia   . History of sleep apnea   . History of UTI   . Hypertension     Past Surgical History:  Procedure Laterality Date  . CESAREAN SECTION  2005  . CESAREAN SECTION  2008  . COLONOSCOPY WITH PROPOFOL N/A 10/16/2014   Procedure: COLONOSCOPY WITH PROPOFOL;  Surgeon: Scot Jun, MD;  Location: [facility] ENDOSCOPY;  Service: Endoscopy;  Laterality: N/A;  . DILATION AND CURETTAGE OF UTERUS  2003    Current Outpatient Medications on File Prior to Visit  Medication Sig Dispense Refill  . clomiPRAMINE (ANAFRANIL) 25 MG capsule Take 1 capsule (25 mg total) by mouth at bedtime. 30 capsule 3  . clonazePAM (KLONOPIN) 0.5 MG tablet Take 1 tablet (0.5 mg total) by mouth 2 (two) times daily. 50 tablet 2  . fluvoxaMINE (LUVOX) 100 MG tablet Take 1 tablet (100 mg total) by mouth at bedtime. 30 tablet 5  . cyanocobalamin (,VITAMIN B-12,) 1000 MCG/ML injection Inject 324 mg into the muscle.  (Patient not taking: Reported on 09/18/2019)     No current facility-administered medications on file prior to visit.    No Known Allergies  Social History   Socioeconomic History  . Marital status: Married    Spouse name: Not on file  . Number of children: Not on file  . Years of education: Not on file  . Highest education level: Not on file  Occupational History  . Not on file  Tobacco Use  . Smoking status: Never Smoker  . Smokeless tobacco: Never Used  Vaping Use  . Vaping Use: Never used  Substance and Sexual Activity  . Alcohol use: No    Alcohol/week: 0.0 standard drinks    Comment: Rare  . Drug use: No  . Sexual activity: Yes    Birth control/protection: None  Other Topics Concern  . Not on file  Social History Narrative  . Not on file   Social Determinants of Health   Financial Resource Strain:   . Difficulty of Paying Living Expenses:   Food Insecurity:   . Worried About Programme researcher, broadcasting/film/video in the Last Year:   . Barista in the Last Year:   Transportation Needs:   . Freight forwarder (Medical):   Marland Kitchen  Lack of Transportation (Non-Medical):   Physical Activity:   . Days of Exercise per Week:   . Minutes of Exercise per Session:   Stress:   . Feeling of Stress :   Social Connections:   . Frequency of Communication with Friends and Family:   . Frequency of Social Gatherings with Friends and Family:   . Attends Religious Services:   . Active Member of Clubs or Organizations:   . Attends Banker Meetings:   Marland Kitchen Marital Status:   Intimate Partner Violence:   . Fear of Current or Ex-Partner:   . Emotionally Abused:   Marland Kitchen Physically Abused:   . Sexually Abused:     Family History  Problem Relation Age of Onset  . Alcohol abuse Father   . Colon cancer Father   . Hyperlipidemia Father   . Other Father        emotional illness  . Ovarian cancer Maternal Grandmother   . Kidney disease Maternal Grandmother   . Prostate cancer  Maternal Grandfather   . Breast cancer Neg Hx     The following portions of the patient's history were reviewed and updated as appropriate: allergies, current medications, past family history, past medical history, past social history, past surgical history and problem list.  Review of Systems  ROS negative except as noted above. Information obtained from patient.   Objective:   BP 139/87   Pulse 73   Ht 5\' 3"  (1.6 m)   Wt 134 lb 6 oz (61 kg)   LMP 08/26/2019 (Exact Date)   BMI 23.80 kg/m    CONSTITUTIONAL: Well-developed, well-nourished female in no acute distress.   PHYSICAL EXAM: Not indicated.   Assessment:   1. Perimenopausal   2. History of hypertension   Plan:   Discussed menses management. Soft disc given for patient to try.   Sample of Slynd provided.   Reviewed red flag symptoms and when to call.   RTC for ANNUAL EXAM or sooner if needed.    10/26/2019, CNM [facility]  A total of 15 minutes were spent face-to-face with the patient during this encounter and over half of that time dealt with counseling and coordination of care.

## 2019-10-08 ENCOUNTER — Ambulatory Visit: Payer: BLUE CROSS/BLUE SHIELD | Admitting: Physician Assistant

## 2019-10-23 ENCOUNTER — Telehealth: Payer: Self-pay | Admitting: Physician Assistant

## 2019-10-23 ENCOUNTER — Encounter: Payer: Self-pay | Admitting: Physician Assistant

## 2019-10-23 ENCOUNTER — Telehealth (INDEPENDENT_AMBULATORY_CARE_PROVIDER_SITE_OTHER): Payer: BLUE CROSS/BLUE SHIELD | Admitting: Physician Assistant

## 2019-10-23 DIAGNOSIS — F411 Generalized anxiety disorder: Secondary | ICD-10-CM

## 2019-10-23 DIAGNOSIS — F429 Obsessive-compulsive disorder, unspecified: Secondary | ICD-10-CM | POA: Diagnosis not present

## 2019-10-23 NOTE — Progress Notes (Signed)
[facility] Med Check  Patient ID: Mallory Jones,  MRN: [id number]  PCP: Danella Penton, MD  Date of Evaluation: 10/23/2019 Time spent:20 minutes  Chief Complaint:  Chief Complaint    Anxiety; Depression     Virtual Visit via Telehealth  I connected with patient by a telephone, with their informed consent, and verified patient privacy and that I am speaking with the correct person using two identifiers.  I am private, in my office and the patient is at home.  I discussed the limitations, risks, security and privacy concerns of performing an evaluation and management service by telephone and the availability of in person appointments. I also discussed with the patient that there may be a patient responsible charge related to this service. The patient expressed understanding and agreed to proceed.   I discussed the assessment and treatment plan with the patient. The patient was provided an opportunity to ask questions and all were answered. The patient agreed with the plan and demonstrated an understanding of the instructions.   The patient was advised to call back or seek an in-person evaluation if the symptoms worsen or if the condition fails to improve as anticipated.  I provided 20 minutes of non-face-to-face time during this encounter.  HISTORY/CURRENT STATUS: HPI for routine med check.  At the last visit in April, we decreased the Luvox, because she prefers to not take any medications or doses, that is not absolutely necessary.  She has been completely fine on the lower dose.  She is not having ruminating thoughts or at least not very often.  And no increase in them since lowering the dose of the Luvox.  No compulsions to speak of.  She has had a very good summer.  She and her family went to [place] and to [place].  They have enjoyed spending time together.  She starts back to school next week. 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  She is able to enjoy  things.  Energy and motivation are good.  She does not isolate.  Does not cry easily.  She sleeps well.  Anxiety is not common at all.  She may need to take the Klonopin twice a month or less.  It is effective when she needs it.  No suicidal or homicidal thoughts.  Denies dizziness, syncope, seizures, numbness, tingling, tremor, tics, unsteady gait, slurred speech, confusion. Denies muscle or joint pain, stiffness, or dystonia.  Individual Medical History/ Review of Systems: Changes? :No    Past medications for mental health diagnoses include: Zoloft, Lexapro, Luvox, gabapentin, Abilify, Klonopin, Risperdal, clomipramine,  Allergies: Patient has no known allergies.  Current Medications:  Current Outpatient Medications:    clomiPRAMINE (ANAFRANIL) 25 MG capsule, Take 1 capsule (25 mg total) by mouth at bedtime., Disp: 30 capsule, Rfl: 3   clonazePAM (KLONOPIN) 0.5 MG tablet, Take 1 tablet (0.5 mg total) by mouth 2 (two) times daily., Disp: 50 tablet, Rfl: 2   Cyanocobalamin (VITAMIN B 12 PO), Take by mouth., Disp: , Rfl:    ferrous sulfate 325 (65 FE) MG tablet, Take 325 mg by mouth daily with breakfast., Disp: , Rfl:    fluvoxaMINE (LUVOX) 100 MG tablet, Take 1 tablet (100 mg total) by mouth at bedtime., Disp: 30 tablet, Rfl: 5   cyanocobalamin (,VITAMIN B-12,) 1000 MCG/ML injection, Inject 324 mg into the muscle. (Patient not taking: Reported on 09/18/2019), Disp: , Rfl:  Medication Side Effects: none  Family Medical/ Social History: Changes? No  MENTAL HEALTH EXAM:  There were no vitals taken for this visit.There is no height or weight on file to calculate BMI.  General Appearance: Unable to assess  Eye Contact:  Good  Speech:  Clear and Coherent and Normal Rate  Volume:  Normal  Mood:  Euthymic  Affect:  Unable to assess  Thought Process:  Goal Directed and Descriptions of Associations: Intact  Orientation:  Full (Time, Place, and Person)  Thought Content: Logical   Suicidal  Thoughts:  No  Homicidal Thoughts:  No  Memory:  WNL  Judgement:  Good  Insight:  Good  Psychomotor Activity:  Normal  Concentration:  Concentration: Good and Attention Span: Good  Recall:  Good  Fund of Knowledge: Good  Language: Good  Assets:  Desire for Improvement  ADL's:  Intact  Cognition: WNL  Prognosis:  Good    DIAGNOSES:    ICD-10-CM   1. Obsessive-compulsive disorder, unspecified type  F42.9   2. Generalized anxiety disorder  F41.1     Receiving Psychotherapy: No    RECOMMENDATIONS:  PDMP was reviewed. I provided 20 minutes of nonface-to-face time during this encounter. I am glad to see that she is doing so well! Continue clomipramine 25 mg, 1 p.o. nightly. Continue Klonopin 0.5 mg, 1 p.o. twice daily as needed.  She takes extremely rarely. Continue Luvox 100 mg, 1 p.o. nightly. Return in 6 months.   Melony Overly, PA-C

## 2019-10-23 NOTE — Telephone Encounter (Signed)
Mallory Jones, Mallory Jones are scheduled for a virtual visit with your provider today.    Just as we do with appointments in the office, we must obtain your consent to participate.  Your consent will be active for this visit and any virtual visit you may have with one of our providers in the next 365 days.    If you have a MyChart account, I can also send a copy of this consent to you electronically.  All virtual visits are billed to your insurance company just like a traditional visit in the office.  As this is a virtual visit, video technology does not allow for your provider to perform a traditional examination.  This may limit your provider's ability to fully assess your condition.  If your provider identifies any concerns that need to be evaluated in person or the need to arrange testing such as labs, EKG, etc, we will make arrangements to do so.    Although advances in technology are sophisticated, we cannot ensure that it will always work on either your end or our end.  If the connection with a video visit is poor, we may have to switch to a telephone visit.  With either a video or telephone visit, we are not always able to ensure that we have a secure connection.   I need to obtain your verbal consent now.   Are you willing to proceed with your visit today?   Mallory Jones has provided verbal consent on 10/23/2019 for a virtual visit (video or telephone).   Melony Overly, PA-C 10/23/2019  3:05 PM

## 2020-01-27 ENCOUNTER — Inpatient Hospital Stay: Payer: BLUE CROSS/BLUE SHIELD

## 2020-01-27 ENCOUNTER — Encounter: Payer: Self-pay | Admitting: Oncology

## 2020-01-27 ENCOUNTER — Inpatient Hospital Stay: Payer: BLUE CROSS/BLUE SHIELD | Attending: Oncology | Admitting: Oncology

## 2020-01-27 VITALS — BP 148/98 | HR 94 | Temp 98.9°F | Resp 16 | Wt 135.4 lb

## 2020-01-27 DIAGNOSIS — Z809 Family history of malignant neoplasm, unspecified: Secondary | ICD-10-CM

## 2020-01-27 DIAGNOSIS — R5383 Other fatigue: Secondary | ICD-10-CM | POA: Insufficient documentation

## 2020-01-27 DIAGNOSIS — F329 Major depressive disorder, single episode, unspecified: Secondary | ICD-10-CM | POA: Insufficient documentation

## 2020-01-27 DIAGNOSIS — R0609 Other forms of dyspnea: Secondary | ICD-10-CM | POA: Insufficient documentation

## 2020-01-27 DIAGNOSIS — D500 Iron deficiency anemia secondary to blood loss (chronic): Secondary | ICD-10-CM

## 2020-01-27 DIAGNOSIS — Z8041 Family history of malignant neoplasm of ovary: Secondary | ICD-10-CM | POA: Insufficient documentation

## 2020-01-27 DIAGNOSIS — Z800 Family history of malignant neoplasm of digestive organs: Secondary | ICD-10-CM | POA: Diagnosis not present

## 2020-01-27 DIAGNOSIS — Z79899 Other long term (current) drug therapy: Secondary | ICD-10-CM | POA: Diagnosis not present

## 2020-01-27 LAB — CBC WITH DIFFERENTIAL/PLATELET
Abs Immature Granulocytes: 0.04 10*3/uL (ref 0.00–0.07)
Basophils Absolute: 0.1 10*3/uL (ref 0.0–0.1)
Basophils Relative: 1 %
Eosinophils Absolute: 0.1 10*3/uL (ref 0.0–0.5)
Eosinophils Relative: 1 %
HCT: 33.1 % — ABNORMAL LOW (ref 36.0–46.0)
Hemoglobin: 9.9 g/dL — ABNORMAL LOW (ref 12.0–15.0)
Immature Granulocytes: 1 %
Lymphocytes Relative: 24 %
Lymphs Abs: 1.7 10*3/uL (ref 0.7–4.0)
MCH: 22.4 pg — ABNORMAL LOW (ref 26.0–34.0)
MCHC: 29.9 g/dL — ABNORMAL LOW (ref 30.0–36.0)
MCV: 74.9 fL — ABNORMAL LOW (ref 80.0–100.0)
Monocytes Absolute: 0.4 10*3/uL (ref 0.1–1.0)
Monocytes Relative: 6 %
Neutro Abs: 4.7 10*3/uL (ref 1.7–7.7)
Neutrophils Relative %: 67 %
Platelets: 266 10*3/uL (ref 150–400)
RBC: 4.42 MIL/uL (ref 3.87–5.11)
RDW: 14.7 % (ref 11.5–15.5)
WBC: 7 10*3/uL (ref 4.0–10.5)
nRBC: 0 % (ref 0.0–0.2)

## 2020-01-27 LAB — FERRITIN: Ferritin: 4 ng/mL — ABNORMAL LOW (ref 11–307)

## 2020-01-27 LAB — IRON AND TIBC
Iron: 30 ug/dL (ref 28–170)
Saturation Ratios: 6 % — ABNORMAL LOW (ref 10.4–31.8)
TIBC: 498 ug/dL — ABNORMAL HIGH (ref 250–450)
UIBC: 468 ug/dL

## 2020-01-27 NOTE — Progress Notes (Signed)
New patient evaluation with history of anemia.

## 2020-01-27 NOTE — Progress Notes (Signed)
Hematology/Oncology Consult note [facility] [phone] [phone]   Patient Care Team: Danella Penton, MD as PCP - General (Internal Medicine)  REFERRING PROVIDER: Danella Penton, MD CHIEF COMPLAINTS/REASON FOR VISIT:  Evaluation of iron deficiency anemia  HISTORY OF PRESENTING ILLNESS:  Mallory Jones is a  49 y.o.  female with PMH listed below was seen in consultation at the request of Danella Penton, MD   for evaluation of iron deficiency anemia.   Reviewed patient's recent labs done at [facility] clinic 01/13/2020, vitamin B12 231.  Hemoglobin 8.9. 09/11/2019, ferritin 3 Reviewed patient's previous labs ordered by primary care physician's office, anemia is chronic onset , duration is since at least 2018.  No aggravating or improving factors.  Associated signs and symptoms: Patient reports fatigue.  She also endorses SOB with exertion.  Denies weight loss, easy bruising, hematochezia, hemoptysis, hematuria. Context:  History of iron deficiency: Patient reports to have longstanding history of iron deficiency.  She has tried oral iron supplementation-Vitron C and she cannot tolerate due to constipation.  Rectal bleeding: Denies.  Last colonoscopy was 5 years ago. Menstrual bleeding/ Vaginal bleeding : Heavy menstrual bleeding, regular Hematemesis or hemoptysis : denies Blood in urine : denies  Family history: Father had colon cancer, maternal grandfather had prostate cancer.  Maternal grandmother had ovarian cancer    Review of Systems  Constitutional: Positive for fatigue. Negative for appetite change, chills and fever.  HENT:   Negative for hearing loss and voice change.   Eyes: Negative for eye problems.  Respiratory: Positive for shortness of breath. Negative for chest tightness and cough.   Cardiovascular: Negative for chest pain.  Gastrointestinal: Negative for abdominal distention, abdominal pain and blood in stool.    Endocrine: Negative for hot flashes.  Genitourinary: Positive for menstrual problem. Negative for difficulty urinating and frequency.   Musculoskeletal: Negative for arthralgias.  Skin: Negative for itching and rash.  Neurological: Negative for extremity weakness.  Hematological: Negative for adenopathy.  Psychiatric/Behavioral: Negative for confusion.    MEDICAL HISTORY:  Past Medical History:  Diagnosis Date  . Anxiety   . History of depression   . History of hay fever   . History of hyperlipidemia   . History of sleep apnea   . History of UTI   . Hypertension     SURGICAL HISTORY: Past Surgical History:  Procedure Laterality Date  . CESAREAN SECTION  2005  . CESAREAN SECTION  2008  . COLONOSCOPY WITH PROPOFOL N/A 10/16/2014   Procedure: COLONOSCOPY WITH PROPOFOL;  Surgeon: Scot Jun, MD;  Location: [facility] ENDOSCOPY;  Service: Endoscopy;  Laterality: N/A;  . DILATION AND CURETTAGE OF UTERUS  2003    SOCIAL HISTORY: Social History   Socioeconomic History  . Marital status: Married    Spouse name: Not on file  . Number of children: Not on file  . Years of education: Not on file  . Highest education level: Not on file  Occupational History  . Not on file  Tobacco Use  . Smoking status: Never Smoker  . Smokeless tobacco: Never Used  Vaping Use  . Vaping Use: Never used  Substance and Sexual Activity  . Alcohol use: No    Alcohol/week: 0.0 standard drinks    Comment: Rare  . Drug use: No  . Sexual activity: Yes    Birth control/protection: None  Other Topics Concern  . Not on file  Social History Narrative  . Not on file  Social Determinants of Health   Financial Resource Strain:   . Difficulty of Paying Living Expenses: Not on file  Food Insecurity:   . Worried About Programme researcher, broadcasting/film/video in the Last Year: Not on file  . Ran Out of Food in the Last Year: Not on file  Transportation Needs:   . Lack of Transportation (Medical): Not on file  . Lack  of Transportation (Non-Medical): Not on file  Physical Activity:   . Days of Exercise per Week: Not on file  . Minutes of Exercise per Session: Not on file  Stress:   . Feeling of Stress : Not on file  Social Connections:   . Frequency of Communication with Friends and Family: Not on file  . Frequency of Social Gatherings with Friends and Family: Not on file  . Attends Religious Services: Not on file  . Active Member of Clubs or Organizations: Not on file  . Attends Banker Meetings: Not on file  . Marital Status: Not on file  Intimate Partner Violence:   . Fear of Current or Ex-Partner: Not on file  . Emotionally Abused: Not on file  . Physically Abused: Not on file  . Sexually Abused: Not on file    FAMILY HISTORY: Family History  Problem Relation Age of Onset  . Alcohol abuse Father   . Colon cancer Father   . Hyperlipidemia Father   . Other Father        emotional illness  . Ovarian cancer Maternal Grandmother   . Kidney disease Maternal Grandmother   . Prostate cancer Maternal Grandfather   . Breast cancer Neg Hx     ALLERGIES:  has No Known Allergies.  MEDICATIONS:  Current Outpatient Medications  Medication Sig Dispense Refill  . clomiPRAMINE (ANAFRANIL) 25 MG capsule Take 1 capsule (25 mg total) by mouth at bedtime. 30 capsule 3  . clonazePAM (KLONOPIN) 0.5 MG tablet Take 1 tablet (0.5 mg total) by mouth 2 (two) times daily. 50 tablet 2  . Cyanocobalamin (VITAMIN B 12 PO) Take by mouth.    . fluvoxaMINE (LUVOX) 100 MG tablet Take 1 tablet (100 mg total) by mouth at bedtime. 30 tablet 5  . cyanocobalamin (,VITAMIN B-12,) 1000 MCG/ML injection Inject 324 mg into the muscle. (Patient not taking: Reported on 09/18/2019)    . ferrous sulfate 325 (65 FE) MG tablet Take 325 mg by mouth daily with breakfast. (Patient not taking: Reported on 01/27/2020)     No current facility-administered medications for this visit.     PHYSICAL EXAMINATION: ECOG  PERFORMANCE STATUS: 1 - Symptomatic but completely ambulatory Vitals:   01/27/20 1505  BP: (!) 148/98  Pulse: 94  Resp: 16  Temp: 98.9 F (37.2 C)   Filed Weights   01/27/20 1505  Weight: 135 lb 6.4 oz (61.4 kg)    Physical Exam Constitutional:      General: She is not in acute distress. HENT:     Head: Normocephalic and atraumatic.  Eyes:     General: No scleral icterus. Cardiovascular:     Rate and Rhythm: Normal rate and regular rhythm.     Heart sounds: Normal heart sounds.  Pulmonary:     Effort: Pulmonary effort is normal. No respiratory distress.     Breath sounds: No wheezing.  Abdominal:     General: Bowel sounds are normal. There is no distension.     Palpations: Abdomen is soft.  Musculoskeletal:        General:  No deformity. Normal range of motion.     Cervical back: Normal range of motion and neck supple.  Skin:    General: Skin is warm and dry.     Findings: No erythema or rash.  Neurological:     Mental Status: She is alert and oriented to person, place, and time. Mental status is at baseline.     Cranial Nerves: No cranial nerve deficit.     Coordination: Coordination normal.  Psychiatric:        Mood and Affect: Mood normal.       CMP Latest Ref Rng & Units 10/11/2017  Glucose 65 - 99 mg/dL 79  BUN 6 - 24 mg/dL 16  Creatinine 4.31 - 5.40 mg/dL 0.86  Sodium 761 - 950 mmol/L 138  Potassium 3.5 - 5.2 mmol/L 4.0  Chloride 96 - 106 mmol/L 100  CO2 20 - 29 mmol/L 24  Calcium 8.7 - 10.2 mg/dL 9.3  Total Protein 6.0 - 8.5 g/dL 6.6  Total Bilirubin 0.0 - 1.2 mg/dL 0.3  Alkaline Phos 39 - 117 IU/L 71  AST 0 - 40 IU/L 15  ALT 0 - 32 IU/L 9   CBC Latest Ref Rng & Units 01/27/2020  WBC 4.0 - 10.5 K/uL 7.0  Hemoglobin 12.0 - 15.0 g/dL 9.3(O)  Hematocrit 36 - 46 % 33.1(L)  Platelets 150 - 400 K/uL 266     LABORATORY DATA:  I have reviewed the data as listed Lab Results  Component Value Date   WBC 7.0 01/27/2020   HGB 9.9 (L) 01/27/2020   HCT  33.1 (L) 01/27/2020   MCV 74.9 (L) 01/27/2020   PLT 266 01/27/2020   No results for input(s): NA, K, CL, CO2, GLUCOSE, BUN, CREATININE, CALCIUM, GFRNONAA, GFRAA, PROT, ALBUMIN, AST, ALT, ALKPHOS, BILITOT, BILIDIR, IBILI in the last 8760 hours. Iron/TIBC/Ferritin/ %Sat    Component Value Date/Time   IRON 30 01/27/2020 1539   TIBC 498 (H) 01/27/2020 1539   FERRITIN 4 (L) 01/27/2020 1539   FERRITIN 8 (L) 10/11/2017 1459   IRONPCTSAT 6 (L) 01/27/2020 1539     RADIOGRAPHIC STUDIES: I have personally reviewed the radiological images as listed and agreed with the findings in the report. No results found.     ASSESSMENT & PLAN:  1. Iron deficiency anemia due to chronic blood loss   2. Family history of cancer    Labs are reviewed and discussed with patient. Consistent with iron deficiency anemia. I recommend patient to repeat CBC, iron, TIBC ferritin. Plan IV iron with Venofer 200mg  weekly x 4 doses. Allergy reactions/infusion reaction including anaphylactic reaction discussed with patient. Other side effects include but not limited to high blood pressure, skin rash, weight gain, leg swelling, etc. discussed with patient that IV Venofer treatments are not recommended during first trimester pregnancy.  Patient denies any possibility of any pregnancy at this point.  Patient voices understanding and willing to proceed  Family history of ovarian cancer and prostate cancer, colon cancer.  Recommend genetic testing.  Patient will think about it and update me in the future.  Recommend patient to follow-up with gastroenterology.  She was recommended previously by GI to repeat colonoscopy 5 years after the last 1  Follow-up in 3 months Orders Placed This Encounter  Procedures  . Ferritin    Standing Status:   Future    Number of Occurrences:   1    Standing Expiration Date:   07/26/2020  . Iron and TIBC    Standing  Status:   Future    Number of Occurrences:   1    Standing Expiration Date:    01/26/2021  . CBC with Differential/Platelet    Standing Status:   Future    Number of Occurrences:   1    Standing Expiration Date:   01/26/2021    All questions were answered. The patient knows to call the clinic with any problems questions or concerns.  Cc Danella PentonMiller, Mark F, MD Thank you for this kind referral and the opportunity to participate in the care of this patient. A copy of today's note is routed to referring provider   Rickard PatienceZhou Kahiau Schewe, MD, PhD Hematology Oncology [facility] 01/27/2020

## 2020-01-28 ENCOUNTER — Telehealth: Payer: Self-pay

## 2020-01-28 NOTE — Telephone Encounter (Signed)
Ok. Thanks!

## 2020-01-28 NOTE — Telephone Encounter (Addendum)
Message received from Althea Charon C. in ins authorization dept.  During the PA request for the Venofer plan she found that we are out of network on patient's insurance coverage.  The BCBS through [facility] will pay very little for her to come here and if scans or expensive meds are needed will not be approved.  Althea Charon contaced patient to inform her of this and she was not aware that we are out of network.  Patient is going to contact her insurance carrier and call back with ins preference of hem/onc.

## 2020-01-28 NOTE — Telephone Encounter (Signed)
-----   Message from Rickard Patience, MD sent at 01/27/2020  7:04 PM EST ----- Please arrange patient to have IV Venofer weekly x4.  Arrange follow-up Labs in 3 months, prior to MD virtually +/- Venofer

## 2020-01-29 NOTE — Telephone Encounter (Signed)
Left message for patient to call for an update on insur preferred hematologist.

## 2020-01-30 NOTE — Telephone Encounter (Signed)
Letter typed in chart and sent to referring provider, Dr. Hyacinth Meeker, informing them of the insurance.

## 2020-03-08 ENCOUNTER — Other Ambulatory Visit: Payer: Self-pay | Admitting: Physician Assistant

## 2020-04-01 ENCOUNTER — Telehealth: Payer: Self-pay | Admitting: Physician Assistant

## 2020-04-01 NOTE — Telephone Encounter (Signed)
Message is blank?

## 2020-05-20 ENCOUNTER — Other Ambulatory Visit: Payer: Self-pay

## 2020-05-21 ENCOUNTER — Telehealth: Payer: Self-pay | Admitting: Physician Assistant

## 2020-05-21 ENCOUNTER — Other Ambulatory Visit: Payer: Self-pay | Admitting: Physician Assistant

## 2020-05-21 MED ORDER — CLOMIPRAMINE HCL 25 MG PO CAPS: 25.0000 mg | ORAL_CAPSULE | Freq: Every day | ORAL | 0 refills | Status: DC

## 2020-05-21 NOTE — Telephone Encounter (Signed)
I have no idea what [facility] pharmacy she wants this sent to. I sent a 30-day supply to the pharmacy where she last had her prescription filled, [facility] in [place] on [address].

## 2020-05-21 NOTE — Telephone Encounter (Signed)
Pt called requesting refill for Clomipramine 25 mg to [facility]. Apt 3/7

## 2020-05-24 ENCOUNTER — Telehealth: Payer: Self-pay | Admitting: Physician Assistant

## 2020-05-24 ENCOUNTER — Other Ambulatory Visit: Payer: Self-pay | Admitting: Physician Assistant

## 2020-05-24 ENCOUNTER — Encounter: Payer: Self-pay | Admitting: Physician Assistant

## 2020-05-24 ENCOUNTER — Telehealth (INDEPENDENT_AMBULATORY_CARE_PROVIDER_SITE_OTHER): Payer: BLUE CROSS/BLUE SHIELD | Admitting: Physician Assistant

## 2020-05-24 DIAGNOSIS — F429 Obsessive-compulsive disorder, unspecified: Secondary | ICD-10-CM

## 2020-05-24 DIAGNOSIS — F411 Generalized anxiety disorder: Secondary | ICD-10-CM | POA: Diagnosis not present

## 2020-05-24 MED ORDER — CLOMIPRAMINE HCL 25 MG PO CAPS: 25.0000 mg | ORAL_CAPSULE | Freq: Every day | ORAL | 3 refills | Status: DC

## 2020-05-24 MED ORDER — FLUVOXAMINE MALEATE 100 MG PO TABS: 100.0000 mg | ORAL_TABLET | Freq: Every day | ORAL | 1 refills | Status: DC

## 2020-05-24 NOTE — Telephone Encounter (Signed)
Rx was faxed to [facility].

## 2020-05-24 NOTE — Telephone Encounter (Signed)
Reviewed thanks! 

## 2020-05-24 NOTE — Telephone Encounter (Signed)
I signed the form for [facility] pharmacy for the clomipramine, and put it in the admin's box upfront to be faxed.

## 2020-05-24 NOTE — Telephone Encounter (Signed)
Excellent point, I did not think about that.  I ordered it on med list, but clicked "no print" and left her pharmacy as the [facility], and documented that she gets through mail order from [facility].  That should take care of it right?

## 2020-05-24 NOTE — Progress Notes (Signed)
[facility]  Patient ID: NURY NEBERGALL,  MRN: [id number]  PCP: Danella Penton, MD  Date of Evaluation: 05/24/2020 Time spent:15 minutes  Chief Complaint:  Chief Complaint    Anxiety; Depression     Virtual Visit via Telehealth  I connected with patient by a telephone, with their informed consent, and verified patient privacy and that I am speaking with the correct person using two identifiers.  I am private, in my office and the patient is at home.  I discussed the limitations, risks, security and privacy concerns of performing an evaluation and management service by telephone and the availability of in person appointments. I also discussed with the patient that there may be a patient responsible charge related to this service. The patient expressed understanding and agreed to proceed.   I discussed the assessment and treatment plan with the patient. The patient was provided an opportunity to ask questions and all were answered. The patient agreed with the plan and demonstrated an understanding of the instructions.   The patient was advised to call back or seek an in-person evaluation if the symptoms worsen or if the condition fails to improve as anticipated.  I provided 15 minutes of non-face-to-face time during this encounter.  HISTORY/CURRENT STATUS: For 6 month med check.   Parents are going through divorce, so that's stressful.  She gets a little bit more irritable around time of her menses but otherwise is doing well.  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  She is able to enjoy things.  Energy and motivation are good.  She does not isolate.  Does not cry easily.  She sleeps well. Klonopin still helps when needed, but usually only needs it 1-2 times a month.  No suicidal or homicidal thoughts.  Denies dizziness, syncope, seizures, numbness, tingling, tremor, tics, unsteady gait, slurred speech, confusion. Denies muscle or  joint pain, stiffness, or dystonia.  Individual Medical History/ Review of Systems: Changes? :No    Past medications for mental health diagnoses include: Zoloft, Lexapro, Luvox, gabapentin, Abilify, Klonopin, Risperdal, clomipramine,  Allergies: Patient has no known allergies.  Current Medications:  Current Outpatient Medications:  .  clomiPRAMINE (ANAFRANIL) 25 MG capsule, Take 1 capsule (25 mg total) by mouth at bedtime., Disp: 30 capsule, Rfl: 0 .  clonazePAM (KLONOPIN) 0.5 MG tablet, Take 1 tablet (0.5 mg total) by mouth 2 (two) times daily., Disp: 50 tablet, Rfl: 2 .  cyanocobalamin (,VITAMIN B-12,) 1000 MCG/ML injection, Inject 324 mg into the muscle. (Patient not taking: No sig reported), Disp: , Rfl:  .  Cyanocobalamin (VITAMIN B 12 PO), Take by mouth. (Patient not taking: Reported on 05/24/2020), Disp: , Rfl:  .  ferrous sulfate 325 (65 FE) MG tablet, Take 325 mg by mouth daily with breakfast. (Patient not taking: No sig reported), Disp: , Rfl:  .  fluvoxaMINE (LUVOX) 100 MG tablet, Take 1 tablet (100 mg total) by mouth at bedtime., Disp: 90 tablet, Rfl: 1 Medication Side Effects: none  Family Medical/ Social History: Changes? No  MENTAL HEALTH EXAM:  There were no vitals taken for this visit.There is no height or weight on file to calculate BMI.  General Appearance: Unable to assess  Eye Contact:  Unable to assess  Speech:  Clear and Coherent and Normal Rate  Volume:  Normal  Mood:  Euthymic  Affect:  Unable to assess  Thought Process:  Goal Directed and Descriptions of Associations: Intact  Orientation:  Full (Time, Place, and Person)  Thought Content: Logical   Suicidal Thoughts:  No  Homicidal Thoughts:  No  Memory:  WNL  Judgement:  Good  Insight:  Good  Psychomotor Activity:  Unable to assess  Concentration:  Concentration: Good and Attention Span: Good  Recall:  Good  Fund of Knowledge: Good  Language: Good  Assets:  Desire for Improvement  ADL's:  Intact   Cognition: WNL  Prognosis:  Good    DIAGNOSES:    ICD-10-CM   1. Obsessive-compulsive disorder, unspecified type  F42.9   2. Generalized anxiety disorder  F41.1     Receiving Psychotherapy: No    RECOMMENDATIONS:  PDMP was reviewed. I provided 15 minutes of nonface-to-face time during this encounter including time spent before and after the visit and chart review and charting.  I am sorry to hear about her parents divorce.  We briefly discussed decreasing or weaning off the clomipramine as she orders it from [place] because it is too expensive here, but agreed to keep her on it because of current stressors in her life.  It is not a good time to change any of her medications for mental health. Continue clomipramine 25 mg, 1 p.o. nightly. Continue Klonopin 0.5 mg, 1 p.o. twice daily as needed.  She takes extremely rarely. Continue Luvox 100 mg, 1 p.o. nightly. Return in 6 months.   Melony Overly, PA-C

## 2020-05-24 NOTE — Telephone Encounter (Signed)
The only thing that concerns me is since we can't send electronically how do we keep up with her refills?

## 2020-05-24 NOTE — Telephone Encounter (Signed)
I was on the phone with pt when you called through for appointment,she will tell you the [facility] info her Rx is too expensive at [facility]

## 2020-05-24 NOTE — Telephone Encounter (Signed)
Patient lm stating she had a virtual visit with Rosey Bath on today. She stated she forgot to tell Rosey Bath she is requesting that her Clomipramine 25 mg be filled by [facility] not [facility]. Direct questions to # [phone].

## 2020-05-24 NOTE — Telephone Encounter (Signed)
I can update if needed

## 2020-05-25 NOTE — Telephone Encounter (Signed)
Yes we will try it and hopefully notice it anyway. Thanks

## 2020-12-17 ENCOUNTER — Telehealth (INDEPENDENT_AMBULATORY_CARE_PROVIDER_SITE_OTHER): Payer: BLUE CROSS/BLUE SHIELD | Admitting: Physician Assistant

## 2020-12-17 ENCOUNTER — Encounter: Payer: Self-pay | Admitting: Physician Assistant

## 2020-12-17 DIAGNOSIS — F411 Generalized anxiety disorder: Secondary | ICD-10-CM

## 2020-12-17 DIAGNOSIS — F429 Obsessive-compulsive disorder, unspecified: Secondary | ICD-10-CM | POA: Diagnosis not present

## 2020-12-17 MED ORDER — CLONAZEPAM 0.5 MG PO TABS: 0.5000 mg | ORAL_TABLET | Freq: Two times a day (BID) | ORAL | 1 refills | Status: DC | PRN

## 2020-12-17 MED ORDER — FLUVOXAMINE MALEATE 100 MG PO TABS: 100.0000 mg | ORAL_TABLET | Freq: Every day | ORAL | 1 refills | Status: DC

## 2020-12-17 NOTE — Progress Notes (Signed)
[facility]  Patient ID: Mallory Jones,  MRN: [id number]  PCP: Danella Penton, MD  Date of Evaluation: 12/17/2020 Time spent:25 minutes  Chief Complaint:  Chief Complaint   Depression; Anxiety; Follow-up     Virtual Visit via Telehealth  I connected with patient by a telephone, with their informed consent, and verified patient privacy and that I am speaking with the correct person using two identifiers.  I am private, in my office and the patient is at home.  I discussed the limitations, risks, security and privacy concerns of performing an evaluation and management service by telephone and the availability of in person appointments. I also discussed with the patient that there may be a patient responsible charge related to this service. The patient expressed understanding and agreed to proceed.   I discussed the assessment and treatment plan with the patient. The patient was provided an opportunity to ask questions and all were answered. The patient agreed with the plan and demonstrated an understanding of the instructions.   The patient was advised to call back or seek an in-person evaluation if the symptoms worsen or if the condition fails to improve as anticipated.  I provided 25 minutes of non-face-to-face time during this encounter.  HISTORY/CURRENT STATUS: For 6 month med check.   Dad died in 07/24/22. Has been sad. Her Mom lives alone now and she worries about her sometimes.  She has teenagers, and works full time so she has a lot going on.  But overall her medications are working well.  Has been needing the Klonopin a little bit more since her father passed away but does not take it very often.  Has been using the same prescription that was given in January 2020.  It does help when she takes it.  She does not obsess about things like she has not.  She is able to enjoy things.  Energy and motivation are good.  She does not isolate.  She sleeps well.  No suicidal or  homicidal thoughts.  Denies dizziness, syncope, seizures, numbness, tingling, tremor, tics, unsteady gait, slurred speech, confusion. Denies muscle or joint pain, stiffness, or dystonia.  Individual Medical History/ Review of Systems: Changes? :No    Past medications for mental health diagnoses include: Zoloft, Lexapro, Luvox, gabapentin, Abilify, Klonopin, Risperdal, clomipramine,  Allergies: Venofer [iron sucrose]  Current Medications:  Current Outpatient Medications:    clomiPRAMINE (ANAFRANIL) 25 MG capsule, Take 1 capsule (25 mg total) by mouth at bedtime., Disp: 90 capsule, Rfl: 3   clonazePAM (KLONOPIN) 0.5 MG tablet, Take 1 tablet (0.5 mg total) by mouth 2 (two) times daily as needed for anxiety., Disp: 60 tablet, Rfl: 1   cyanocobalamin (,VITAMIN B-12,) 1000 MCG/ML injection, Inject 324 mg into the muscle. (Patient not taking: No sig reported), Disp: , Rfl:    Cyanocobalamin (VITAMIN B 12 PO), Take by mouth. (Patient not taking: No sig reported), Disp: , Rfl:    ferrous sulfate 325 (65 FE) MG tablet, Take 325 mg by mouth daily with breakfast. (Patient not taking: No sig reported), Disp: , Rfl:    fluvoxaMINE (LUVOX) 100 MG tablet, Take 1 tablet (100 mg total) by mouth at bedtime., Disp: 90 tablet, Rfl: 1 Medication Side Effects: none  Family Medical/ Social History: Changes? Dad died in 2022-07-24 from cirrhosis.   MENTAL HEALTH EXAM:  There were no vitals taken for this visit.There is no height or weight on file to calculate BMI.  General Appearance:  Unable to assess  Eye Contact:   Unable to assess  Speech:  Clear and Coherent and Normal Rate  Volume:  Normal  Mood:  Euthymic  Affect:   Unable to assess  Thought Process:  Goal Directed and Descriptions of Associations: Circumstantial  Orientation:  Full (Time, Place, and Person)  Thought Content: Logical   Suicidal Thoughts:  No  Homicidal Thoughts:  No  Memory:  WNL  Judgement:  Good  Insight:  Good  Psychomotor  Activity:   Unable to assess  Concentration:  Concentration: Good and Attention Span: Good  Recall:  Good  Fund of Knowledge: Good  Language: Good  Assets:  Desire for Improvement  ADL's:  Intact  Cognition: WNL  Prognosis:  Good    DIAGNOSES:    ICD-10-CM   1. Obsessive-compulsive disorder, unspecified type  F42.9     2. Generalized anxiety disorder  F41.1        Receiving Psychotherapy: No    RECOMMENDATIONS:  PDMP was reviewed. Unable to see when she last filled the Klonopin it so far back. I provided 25 minutes of non-face-to-face time during this encounter, including time spent before and after the visit in records review, medical decision making, and charting.  She is doing well medication wise, mentions the possibility of decreasing the dose at some point may be.  We both agree that now is not the time but we can discuss after the holidays.  I know that will be harder on her this year. My condolences in the loss of her dad. Continue clomipramine 25 mg, 1 p.o. nightly. Continue Klonopin 0.5 mg, 1 p.o. twice daily as needed.  She takes extremely rarely. Continue Luvox 100 mg, 1 p.o. nightly. Return in 4 months.  Melony Overly, PA-C

## 2021-04-06 ENCOUNTER — Other Ambulatory Visit: Payer: Self-pay | Admitting: Physician Assistant

## 2021-04-06 MED ORDER — CLOMIPRAMINE HCL 25 MG PO CAPS: 25.0000 mg | ORAL_CAPSULE | Freq: Every day | ORAL | 3 refills | Status: AC

## 2021-05-10 ENCOUNTER — Other Ambulatory Visit: Payer: Self-pay | Admitting: Internal Medicine

## 2021-05-10 MED ORDER — AZITHROMYCIN 250 MG PO TABS: ORAL_TABLET | ORAL | 0 refills | Status: AC

## 2021-09-02 IMAGING — MG DIGITAL SCREENING BILAT W/ TOMO W/ CAD
8 series · 9 of 24 positions shown · non-contrast
Comparison: Previous exam(s).

CLINICAL DATA: Screening.

EXAM:
DIGITAL SCREENING BILATERAL MAMMOGRAM WITH TOMO AND CAD

[R MLO synth-2D]
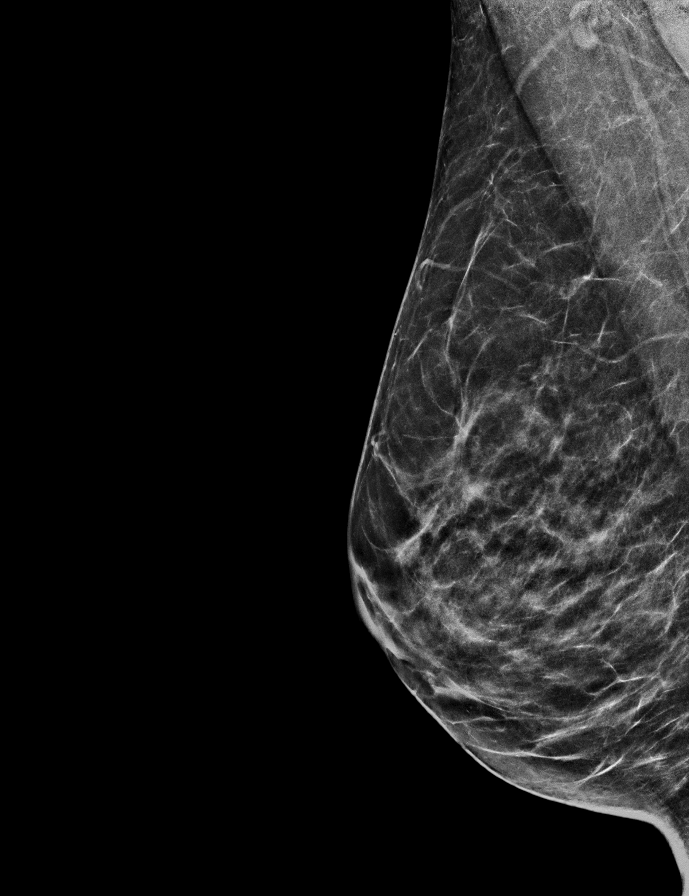

[L CC synth-2D]
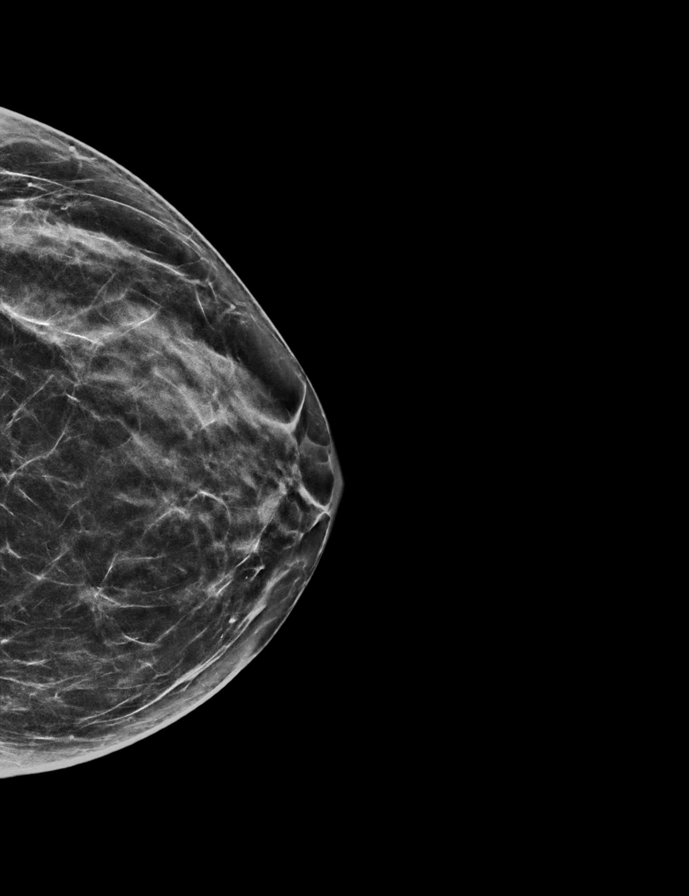

[L MLO synth-2D]
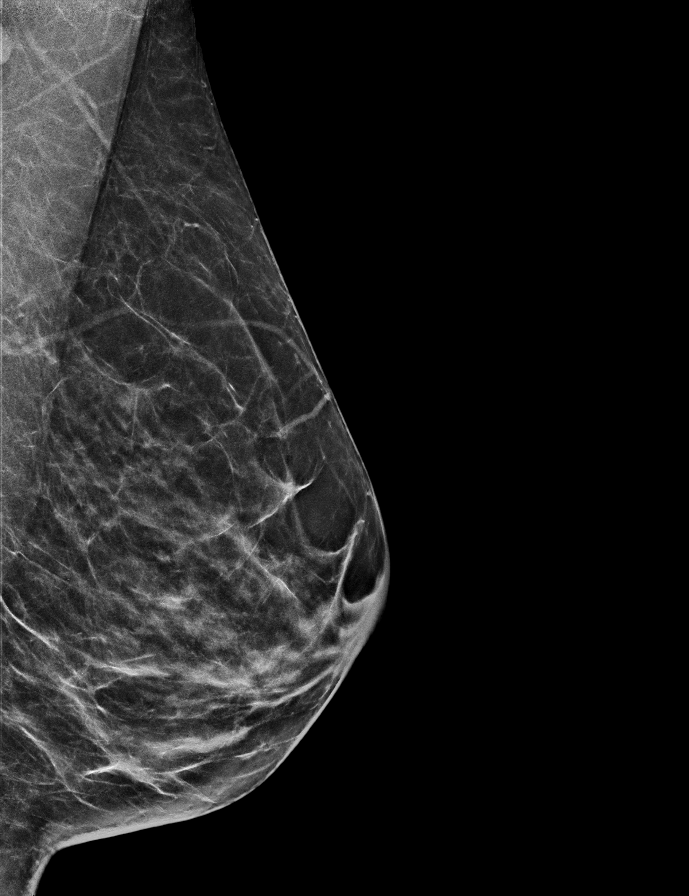

[R CC synth-2D]
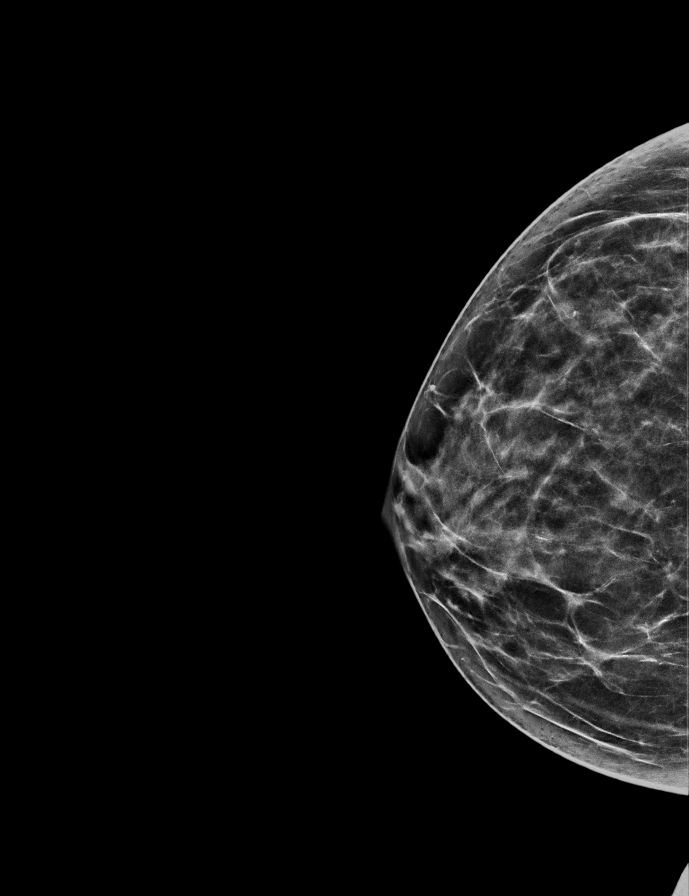

[L MLO tomo · 2 of 56 frames shown]
[frame 19/56]
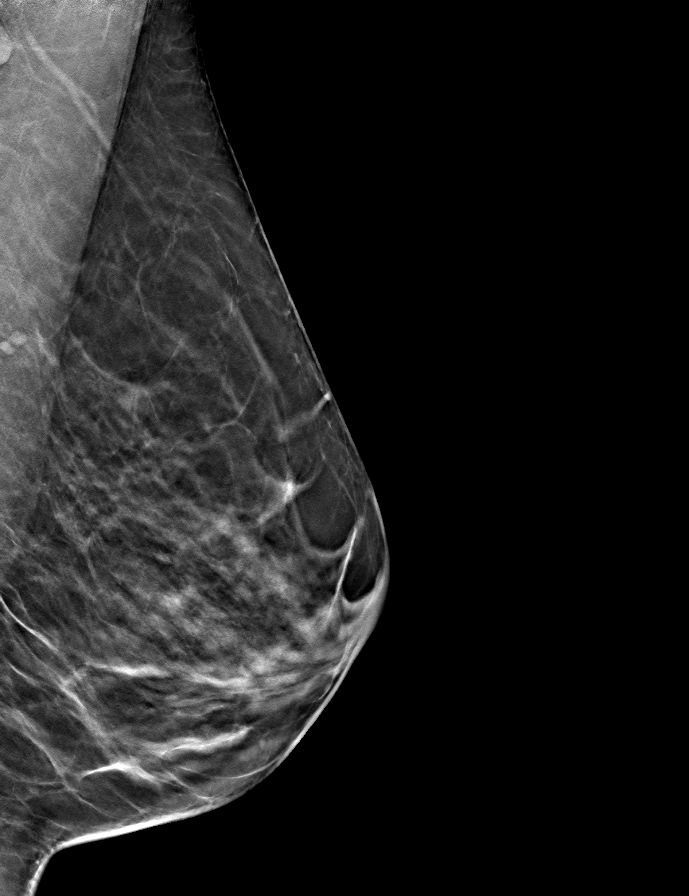
[frame 29/56]
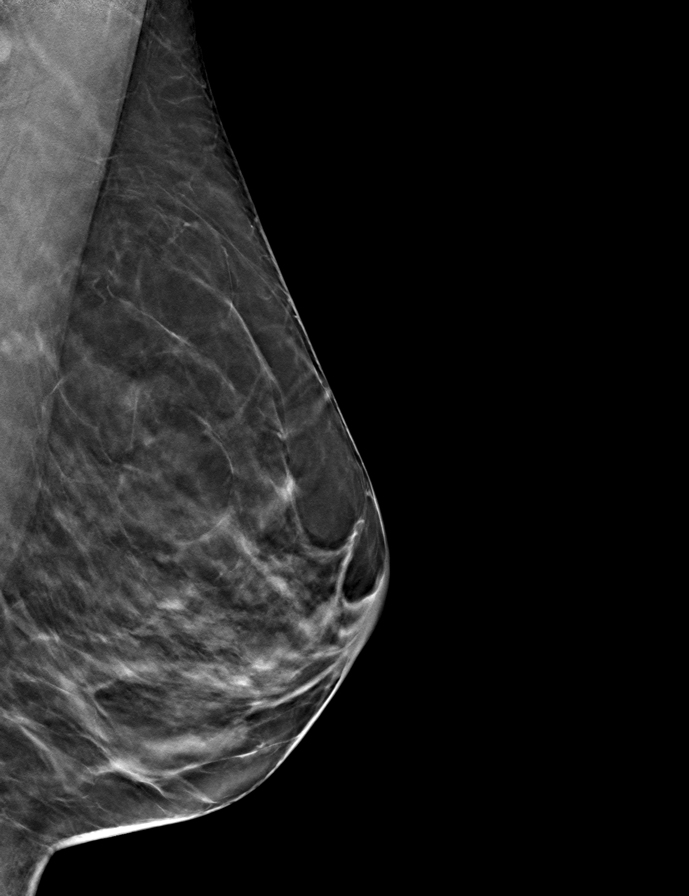

[R MLO tomo · tomo slice 28/55.0]
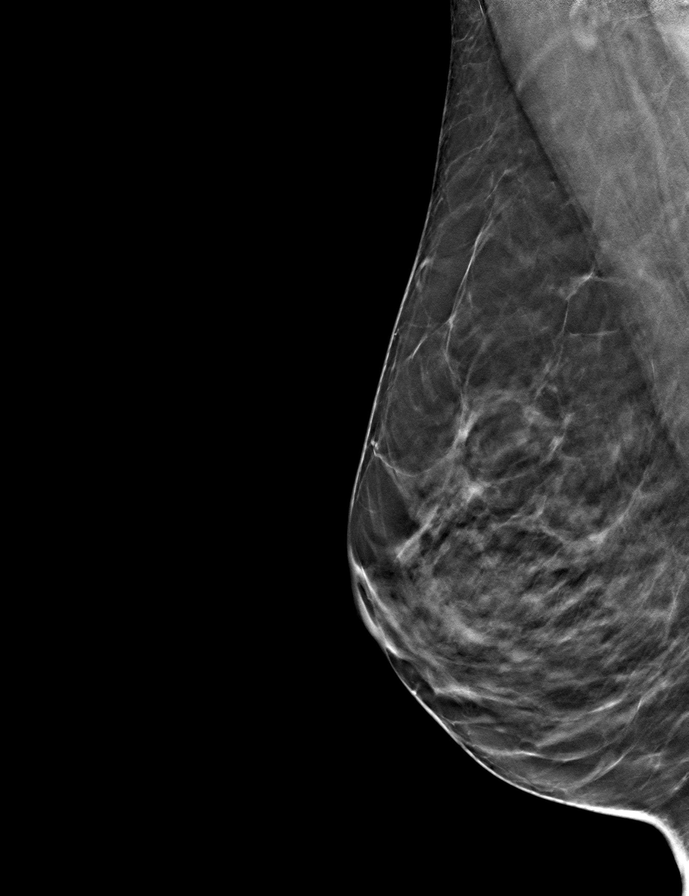

[R CC tomo · tomo slice 29/58.0]
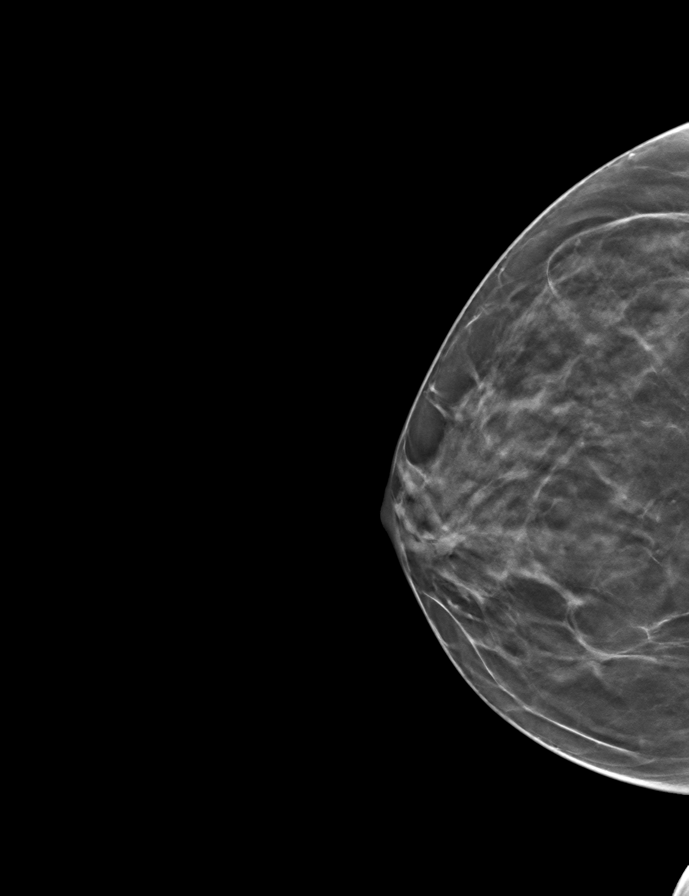

[L CC tomo · tomo slice 30/59.0]
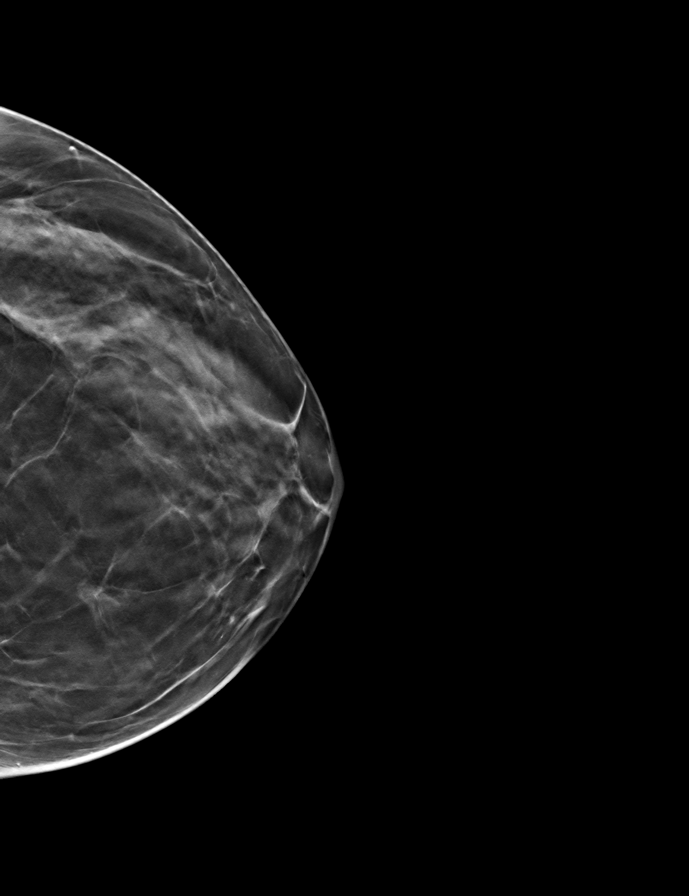

[9 of 24 positions shown; findings below may reference images not displayed]

ACR Breast Density Category c: The breast tissue is heterogeneously
dense, which may obscure small masses.
FINDINGS: There are no findings suspicious for malignancy. Images were
processed with CAD.
IMPRESSION: No mammographic evidence of malignancy. A result letter of this
screening mammogram will be mailed directly to the patient.

RECOMMENDATION:
Screening mammogram in one year. (Code:FT-U-LHB)

BI-RADS CATEGORY  1: Negative.

## 2022-02-21 ENCOUNTER — Encounter: Payer: Self-pay | Admitting: Physician Assistant

## 2022-02-21 ENCOUNTER — Telehealth (INDEPENDENT_AMBULATORY_CARE_PROVIDER_SITE_OTHER): Payer: BLUE CROSS/BLUE SHIELD | Admitting: Physician Assistant

## 2022-02-21 DIAGNOSIS — F429 Obsessive-compulsive disorder, unspecified: Secondary | ICD-10-CM | POA: Diagnosis not present

## 2022-02-21 DIAGNOSIS — F411 Generalized anxiety disorder: Secondary | ICD-10-CM

## 2022-02-21 MED ORDER — FLUVOXAMINE MALEATE 100 MG PO TABS: 100.0000 mg | ORAL_TABLET | Freq: Every day | ORAL | 1 refills | Status: DC

## 2022-02-21 NOTE — Progress Notes (Signed)
[facility]  Patient ID: Mallory Jones,  MRN: [id number]  PCP: Danella Penton, MD  Date of Evaluation: 02/21/2022 Time spent:20 minutes  Chief Complaint:  Chief Complaint   Anxiety; Depression; Follow-up    Virtual Visit via Telehealth  I connected with patient by a video enabled telemedicine application with their informed consent, and verified patient privacy and that I am speaking with the correct person using two identifiers.  I am private, in my office and the patient is at home.  I discussed the limitations, risks, security and privacy concerns of performing an evaluation and management service by telephone video and the availability of in person appointments. I also discussed with the patient that there may be a patient responsible charge related to this service. The patient expressed understanding and agreed to proceed.   I discussed the assessment and treatment plan with the patient. The patient was provided an opportunity to ask questions and all were answered. The patient agreed with the plan and demonstrated an understanding of the instructions.   The patient was advised to call back or seek an in-person evaluation if the symptoms worsen or if the condition fails to improve as anticipated.  I provided 20  minutes of non-face-to-face time during this encounter.  HISTORY/CURRENT STATUS: For routine med check.   Doing well overall. Patient is able to enjoy things.  Energy and motivation are good.  Work is going well.   No extreme sadness, tearfulness, or feelings of hopelessness.  Sleeps well most of the time. ADLs and personal hygiene are normal.   Denies any changes in concentration, making decisions, or remembering things.  Appetite has not changed.  Weight is stable.  Rarely needs the Klonopin. No c/o obsessive thoughts. No compulsions. Denies suicidal or homicidal thoughts.  Patient denies increased energy with decreased need for sleep, increased  talkativeness, racing thoughts, impulsivity or risky behaviors, increased spending, increased libido, grandiosity, increased irritability or anger, paranoia, or hallucinations.  Denies dizziness, syncope, seizures, numbness, tingling, tremor, tics, unsteady gait, slurred speech, confusion. Denies muscle or joint pain, stiffness, or dystonia.  Individual Medical History/ Review of Systems: Changes? :Yes    sick w/ a virus now  Past medications for mental health diagnoses include: Zoloft, Lexapro, Luvox, gabapentin, Abilify, Klonopin, Risperdal, clomipramine,  Allergies: Venofer [iron sucrose]  Current Medications:  Current Outpatient Medications:    clomiPRAMINE (ANAFRANIL) 25 MG capsule, Take 1 capsule (25 mg total) by mouth at bedtime., Disp: 90 capsule, Rfl: 3   clonazePAM (KLONOPIN) 0.5 MG tablet, Take 1 tablet (0.5 mg total) by mouth 2 (two) times daily as needed for anxiety., Disp: 60 tablet, Rfl: 1   azithromycin (ZITHROMAX) 250 MG tablet, Take 2 tabs today, then 1 tab daily x 4 days (Patient not taking: Reported on 02/21/2022), Disp: 6 tablet, Rfl: 0   cyanocobalamin (,VITAMIN B-12,) 1000 MCG/ML injection, Inject 324 mg into the muscle. (Patient not taking: Reported on 09/18/2019), Disp: , Rfl:    Cyanocobalamin (VITAMIN B 12 PO), Take by mouth. (Patient not taking: Reported on 05/24/2020), Disp: , Rfl:    ferrous sulfate 325 (65 FE) MG tablet, Take 325 mg by mouth daily with breakfast. (Patient not taking: Reported on 01/27/2020), Disp: , Rfl:    fluvoxaMINE (LUVOX) 100 MG tablet, Take 1 tablet (100 mg total) by mouth at bedtime., Disp: 90 tablet, Rfl: 1 Medication Side Effects: none  Family Medical/ Social History: Changes? Dad died in 07/05/22 from cirrhosis.   MENTAL HEALTH EXAM:  There were no vitals taken for this visit.There is no height or weight on file to calculate BMI.  General Appearance: Casual  Eye Contact:  Good  Speech:  Clear and Coherent and Normal Rate  Volume:  Normal   Mood:  Euthymic  Affect:  Congruent  Thought Process:  Goal Directed and Descriptions of Associations: Circumstantial  Orientation:  Full (Time, Place, and Person)  Thought Content: Logical   Suicidal Thoughts:  No  Homicidal Thoughts:  No  Memory:  WNL  Judgement:  Good  Insight:  Good  Psychomotor Activity:  Normal  Concentration:  Concentration: Good and Attention Span: Good  Recall:  Good  Fund of Knowledge: Good  Language: Good  Assets:  Desire for Improvement Financial Resources/Insurance Housing Transportation Vocational/Educational  ADL's:  Intact  Cognition: WNL  Prognosis:  Good   DIAGNOSES:    ICD-10-CM   1. Obsessive-compulsive disorder, unspecified type  F42.9     2. Generalized anxiety disorder  F41.1       Receiving Psychotherapy: No   RECOMMENDATIONS:  PDMP was reviewed.  Klonopin filled 12/17/2020.   I provided 20  minutes of non-face-to-face time during this encounter, including time spent before and after the visit in records review, medical decision making, counseling pertinent to today's visit, and charting.   She's doing well so no change needed.  Continue clomipramine 25 mg, 1 p.o. nightly. Continue Klonopin 0.5 mg, 1 p.o. twice daily as needed.  She takes extremely rarely. Continue Luvox 100 mg, 1 p.o. nightly. (Ok to increase if needed) Return in 6 months.  Melony Overly, PA-C

## 2022-05-15 ENCOUNTER — Other Ambulatory Visit: Payer: Self-pay | Admitting: Physician Assistant

## 2023-01-05 ENCOUNTER — Encounter: Payer: Self-pay | Admitting: Physician Assistant

## 2023-01-05 ENCOUNTER — Ambulatory Visit: Payer: BLUE CROSS/BLUE SHIELD | Admitting: Physician Assistant

## 2023-01-05 DIAGNOSIS — F411 Generalized anxiety disorder: Secondary | ICD-10-CM

## 2023-01-05 DIAGNOSIS — F429 Obsessive-compulsive disorder, unspecified: Secondary | ICD-10-CM

## 2023-01-05 MED ORDER — CLONAZEPAM 0.5 MG PO TABS: 0.5000 mg | ORAL_TABLET | Freq: Two times a day (BID) | ORAL | 1 refills | Status: AC | PRN

## 2023-01-05 MED ORDER — FLUVOXAMINE MALEATE 100 MG PO TABS: 100.0000 mg | ORAL_TABLET | Freq: Every day | ORAL | 1 refills | Status: AC

## 2023-01-05 NOTE — Progress Notes (Signed)
[facility]  Patient ID: Mallory Jones,  MRN: [id number]  PCP: Danella Penton, MD  Date of Evaluation: 01/05/2023. Time spent:20 minutes  Chief Complaint:  Virtual Visit via Telehealth  I connected with patient by a video enabled telemedicine application or telephone, with their informed consent, and verified patient privacy and that I am speaking with the correct person using two identifiers.  I am private, in my office and the patient is at home work.  I discussed the limitations, risks, security and privacy concerns of performing an evaluation and management service by telephone video and the availability of in person appointments. I also discussed with the patient that there may be a patient responsible charge related to this service. The patient expressed understanding and agreed to proceed.   I discussed the assessment and treatment plan with the patient. The patient was provided an opportunity to ask questions and all were answered. The patient agreed with the plan and demonstrated an understanding of the instructions.   The patient was advised to call back or seek an in-person evaluation if the symptoms worsen or if the condition fails to improve as anticipated.  I provided 20  30  40  minutes of non-face-to-face time during this encounter.  HISTORY/CURRENT STATUS: For routine med check.  4 months overdue.      Individual Medical History/ Review of Systems: Changes? :No       Past medications for mental health diagnoses include: Zoloft, Lexapro, Luvox, gabapentin, Abilify, Klonopin, Risperdal, clomipramine,  Allergies: Venofer [iron sucrose]  Current Medications:  Current Outpatient Medications:    azithromycin (ZITHROMAX) 250 MG tablet, Take 2 tabs today, then 1 tab daily x 4 days (Patient not taking: Reported on 02/21/2022), Disp: 6 tablet, Rfl: 0   clomiPRAMINE (ANAFRANIL) 25 MG capsule, Take 1 capsule (25 mg total) by mouth at bedtime., Disp: 90  capsule, Rfl: 3   clonazePAM (KLONOPIN) 0.5 MG tablet, Take 1 tablet (0.5 mg total) by mouth 2 (two) times daily as needed for anxiety., Disp: 60 tablet, Rfl: 1   cyanocobalamin (,VITAMIN B-12,) 1000 MCG/ML injection, Inject 324 mg into the muscle. (Patient not taking: Reported on 09/18/2019), Disp: , Rfl:    Cyanocobalamin (VITAMIN B 12 PO), Take by mouth. (Patient not taking: Reported on 05/24/2020), Disp: , Rfl:    ferrous sulfate 325 (65 FE) MG tablet, Take 325 mg by mouth daily with breakfast. (Patient not taking: Reported on 01/27/2020), Disp: , Rfl:    fluvoxaMINE (LUVOX) 100 MG tablet, TAKE 1 TABLET(100 MG) BY MOUTH AT BEDTIME, Disp: 90 tablet, Rfl: 1 Medication Side Effects: none  Family Medical/ Social History: Changes? Dad died in 2022-08-04 from cirrhosis.   MENTAL HEALTH EXAM:  There were no vitals taken for this visit.There is no height or weight on file to calculate BMI.  General Appearance: Casual  Eye Contact:  Good  Speech:  Clear and Coherent and Normal Rate  Volume:  Normal  Mood:  Euthymic  Affect:  Congruent  Thought Process:  Goal Directed and Descriptions of Associations: Circumstantial  Orientation:  Full (Time, Place, and Person)  Thought Content: Logical   Suicidal Thoughts:  No  Homicidal Thoughts:  No  Memory:  WNL  Judgement:  Good  Insight:  Good  Psychomotor Activity:  Normal  Concentration:  Concentration: Good and Attention Span: Good  Recall:  Good  Fund of Knowledge: Good  Language: Good  Assets:  Desire for Improvement Financial Resources/Insurance Housing Transportation Vocational/Educational  ADL's:  Intact  Cognition: WNL  Prognosis:  Good   DIAGNOSES:  No diagnosis found.  Receiving Psychotherapy: No   RECOMMENDATIONS:  PDMP was reviewed.  No recent controlled substances.   I provided  minutes of non-face-to-face time during this encounter, including time spent before and after the visit in records review, medical decision making,  counseling pertinent to today's visit, and charting.     Continue clomipramine 25 mg, 1 p.o. nightly.  (She uses [facility] and [place] to have this filled.  I am unable to find it in epic so she will ask the pharmacy to reach out to me by fax for a refill request.) Continue Klonopin 0.5 mg, 1 p.o. twice daily as needed.  She takes extremely rarely. Continue Luvox 100 mg, 1 p.o. nightly. (Ok to increase if needed) Return in 6 months.  Melony Overly, PA-C

## 2023-01-07 ENCOUNTER — Encounter: Payer: Self-pay | Admitting: Physician Assistant

## 2023-01-09 ENCOUNTER — Telehealth: Payer: Self-pay | Admitting: Physician Assistant

## 2023-01-09 NOTE — Telephone Encounter (Signed)
Pt called and said that she uses [facility] in [place] to get her clomiprame 25 mg. The fax number is [phone]

## 2023-01-09 NOTE — Telephone Encounter (Signed)
Fax for RF put in Teresa's box.

## 2023-07-04 ENCOUNTER — Telehealth: Payer: BLUE CROSS/BLUE SHIELD | Admitting: Physician Assistant

## 2023-08-28 ENCOUNTER — Telehealth: Admitting: Physician Assistant

## 2023-12-24 ENCOUNTER — Other Ambulatory Visit: Payer: Self-pay | Admitting: Physician Assistant

## 2023-12-29 ENCOUNTER — Encounter: Payer: Self-pay | Admitting: Internal Medicine

## 2023-12-29 MED ORDER — AMOXICILLIN-POT CLAVULANATE 875-125 MG PO TABS: 1.0000 | ORAL_TABLET | Freq: Two times a day (BID) | ORAL | 0 refills | Status: AC
